# Patient Record
Sex: Female | Born: 1995 | Race: White | Hispanic: No | Marital: Married | Smoking: Current every day smoker
Health system: Southern US, Community
[De-identification: ages and names within clinical notes are randomized; demographics above are authoritative.]

## PROBLEM LIST (undated history)

## (undated) ENCOUNTER — Inpatient Hospital Stay: Payer: Self-pay

## (undated) DIAGNOSIS — N390 Urinary tract infection, site not specified: Secondary | ICD-10-CM

## (undated) DIAGNOSIS — K37 Unspecified appendicitis: Secondary | ICD-10-CM

## (undated) HISTORY — PX: APPENDECTOMY: SHX54

## (undated) HISTORY — DX: Unspecified appendicitis: K37

## (undated) HISTORY — DX: Urinary tract infection, site not specified: N39.0

## (undated) HISTORY — PX: TUBAL LIGATION: SHX77

---
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
## 2014-08-13 ENCOUNTER — Observation Stay
Admission: EM | Admit: 2014-08-13 | Discharge: 2014-08-14 | Disposition: A | Discharge status: Home / Self Care | Payer: 59 | Attending: Surgery | Admitting: Surgery

## 2014-08-13 ENCOUNTER — Emergency Department: Payer: 59 | Admitting: Anesthesiology

## 2014-08-13 ENCOUNTER — Other Ambulatory Visit: Payer: Self-pay | Admitting: Family Medicine

## 2014-08-13 ENCOUNTER — Ambulatory Visit
Admission: RE | Admit: 2014-08-13 | Discharge: 2014-08-13 | Disposition: A | Discharge status: Home / Self Care | Payer: 59 | Source: Ambulatory Visit | Attending: Family Medicine | Admitting: Family Medicine

## 2014-08-13 ENCOUNTER — Encounter
Admission: EM | Disposition: A | Discharge status: Home / Self Care | Payer: Self-pay | Source: Home / Self Care | Attending: Emergency Medicine

## 2014-08-13 DIAGNOSIS — F17210 Nicotine dependence, cigarettes, uncomplicated: Secondary | ICD-10-CM | POA: Diagnosis not present

## 2014-08-13 DIAGNOSIS — K353 Acute appendicitis with localized peritonitis, without perforation or gangrene: Secondary | ICD-10-CM

## 2014-08-13 DIAGNOSIS — R109 Unspecified abdominal pain: Secondary | ICD-10-CM | POA: Diagnosis not present

## 2014-08-13 DIAGNOSIS — R1031 Right lower quadrant pain: Secondary | ICD-10-CM

## 2014-08-13 DIAGNOSIS — Z87440 Personal history of urinary (tract) infections: Secondary | ICD-10-CM | POA: Diagnosis not present

## 2014-08-13 DIAGNOSIS — K3580 Unspecified acute appendicitis: Secondary | ICD-10-CM | POA: Diagnosis not present

## 2014-08-13 DIAGNOSIS — N61 Mastitis without abscess: Secondary | ICD-10-CM | POA: Diagnosis present

## 2014-08-13 HISTORY — PX: LAPAROSCOPIC APPENDECTOMY: SHX408

## 2014-08-13 LAB — BASIC METABOLIC PANEL
ANION GAP: 7 (ref 5–15)
BUN: 9 mg/dL (ref 6–20)
CHLORIDE: 106 mmol/L (ref 101–111)
CO2: 24 mmol/L (ref 22–32)
CREATININE: 0.67 mg/dL (ref 0.44–1.00)
Calcium: 9.7 mg/dL (ref 8.9–10.3)
GFR calc Af Amer: >60 mL/min (ref >60)
GFR calc non Af Amer: >60 mL/min (ref >60)
Glucose, Bld: 97 mg/dL (ref 65–99)
Potassium: 3.2 mmol/L — ABNORMAL LOW (ref 3.5–5.1)
Sodium: 137 mmol/L (ref 135–145)

## 2014-08-13 LAB — CBC WITH DIFFERENTIAL/PLATELET
Basophils Absolute: 0.1 10*3/uL (ref 0–0.1)
Basophils Relative: 1 %
EOS ABS: 0 10*3/uL (ref 0–0.7)
Eosinophils Relative: 0 %
HCT: 40.1 % (ref 35.0–47.0)
Hemoglobin: 13.8 g/dL (ref 12.0–16.0)
LYMPHS ABS: 2.1 10*3/uL (ref 1.0–3.6)
Lymphocytes Relative: 23 %
MCH: 30.1 pg (ref 26.0–34.0)
MCHC: 34.3 g/dL (ref 32.0–36.0)
MCV: 87.5 fL (ref 80.0–100.0)
MONO ABS: 0.5 10*3/uL (ref 0.2–0.9)
Monocytes Relative: 6 %
NEUTROS ABS: 6.1 10*3/uL (ref 1.4–6.5)
NEUTROS PCT: 70 %
Platelets: 217 10*3/uL (ref 150–440)
RBC: 4.58 MIL/uL (ref 3.80–5.20)
RDW: 13.4 % (ref 11.5–14.5)
WBC: 8.8 10*3/uL (ref 3.6–11.0)

## 2014-08-13 SURGERY — APPENDECTOMY, LAPAROSCOPIC
Anesthesia: General

## 2014-08-13 MED ORDER — FENTANYL CITRATE (PF) 100 MCG/2ML IJ SOLN
INTRAMUSCULAR | Status: AC
  Filled 2014-08-13: qty 2

## 2014-08-13 MED ORDER — HYDROCODONE-ACETAMINOPHEN 5-325 MG PO TABS: 1.0000 | ORAL_TABLET | ORAL | Status: DC | PRN

## 2014-08-13 MED ORDER — DEXTROSE 5 % IV SOLN
1.0000 g | Freq: Two times a day (BID) | INTRAVENOUS | Status: DC
Start: 2014-08-13 — End: 2014-08-14
  Administered 2014-08-13: 1000 mg via INTRAVENOUS
  Filled 2014-08-13 (×4): qty 1

## 2014-08-13 MED ORDER — NEOSTIGMINE METHYLSULFATE 10 MG/10ML IV SOLN
INTRAVENOUS | Status: DC | PRN
  Administered 2014-08-13: 3 mg via INTRAVENOUS

## 2014-08-13 MED ORDER — LACTATED RINGERS IV SOLN
INTRAVENOUS | Status: DC | PRN
  Administered 2014-08-13: 20:00:00 via INTRAVENOUS

## 2014-08-13 MED ORDER — PROPOFOL 10 MG/ML IV BOLUS
INTRAVENOUS | Status: DC | PRN
  Administered 2014-08-13: 150 mg via INTRAVENOUS

## 2014-08-13 MED ORDER — HYDROCODONE-ACETAMINOPHEN 5-325 MG PO TABS
1.0000 | ORAL_TABLET | ORAL | Status: DC | PRN
  Administered 2014-08-13 – 2014-08-14 (×3): 1 via ORAL
  Filled 2014-08-13 (×3): qty 1

## 2014-08-13 MED ORDER — BUPIVACAINE-EPINEPHRINE (PF) 0.25% -1:200000 IJ SOLN
INTRAMUSCULAR | Status: DC | PRN
  Administered 2014-08-13: 15 mL

## 2014-08-13 MED ORDER — ONDANSETRON HCL 4 MG/2ML IJ SOLN: 4.0000 mg | Freq: Once | INTRAMUSCULAR | Status: DC | PRN

## 2014-08-13 MED ORDER — ONDANSETRON HCL 4 MG/2ML IJ SOLN
4.0000 mg | Freq: Four times a day (QID) | INTRAMUSCULAR | Status: DC | PRN
  Administered 2014-08-13: 4 mg via INTRAVENOUS

## 2014-08-13 MED ORDER — ONDANSETRON HCL 4 MG/2ML IJ SOLN
4.0000 mg | Freq: Four times a day (QID) | INTRAMUSCULAR | Status: DC | PRN
  Administered 2014-08-13: 4 mg via INTRAVENOUS
  Filled 2014-08-13: qty 2

## 2014-08-13 MED ORDER — IOHEXOL 300 MG/ML  SOLN
80.0000 mL | Freq: Once | INTRAMUSCULAR | Status: AC | PRN
  Administered 2014-08-13: 80 mL via INTRAVENOUS

## 2014-08-13 MED ORDER — LIDOCAINE HCL (CARDIAC) 20 MG/ML IV SOLN
INTRAVENOUS | Status: DC | PRN
  Administered 2014-08-13: 30 mg via INTRAVENOUS

## 2014-08-13 MED ORDER — ONDANSETRON HCL 4 MG PO TABS: 4.0000 mg | ORAL_TABLET | Freq: Four times a day (QID) | ORAL | Status: DC | PRN

## 2014-08-13 MED ORDER — LACTATED RINGERS IV SOLN
INTRAVENOUS | Status: DC
  Administered 2014-08-13: 22:00:00 via INTRAVENOUS

## 2014-08-13 MED ORDER — FENTANYL CITRATE (PF) 100 MCG/2ML IJ SOLN
INTRAMUSCULAR | Status: DC | PRN
  Administered 2014-08-13 (×4): 50 ug via INTRAVENOUS

## 2014-08-13 MED ORDER — MIDAZOLAM HCL 2 MG/2ML IJ SOLN
INTRAMUSCULAR | Status: DC | PRN
  Administered 2014-08-13: 2 mg via INTRAVENOUS

## 2014-08-13 MED ORDER — ROCURONIUM BROMIDE 100 MG/10ML IV SOLN
INTRAVENOUS | Status: DC | PRN
  Administered 2014-08-13: 10 mg via INTRAVENOUS

## 2014-08-13 MED ORDER — MORPHINE SULFATE 2 MG/ML IJ SOLN
2.0000 mg | INTRAMUSCULAR | Status: DC | PRN
  Administered 2014-08-13 – 2014-08-14 (×2): 2 mg via INTRAVENOUS

## 2014-08-13 MED ORDER — SODIUM CHLORIDE 0.9 % IR SOLN
Status: DC | PRN
  Administered 2014-08-13: 3000 mL

## 2014-08-13 MED ORDER — GLYCOPYRROLATE 0.2 MG/ML IJ SOLN
INTRAMUSCULAR | Status: DC | PRN
  Administered 2014-08-13: 0.4 mg via INTRAVENOUS

## 2014-08-13 MED ORDER — DEXAMETHASONE SODIUM PHOSPHATE 4 MG/ML IJ SOLN
INTRAMUSCULAR | Status: DC | PRN
  Administered 2014-08-13: 4 mg via INTRAVENOUS

## 2014-08-13 MED ORDER — FENTANYL CITRATE (PF) 100 MCG/2ML IJ SOLN
25.0000 ug | INTRAMUSCULAR | Status: AC | PRN
  Administered 2014-08-13 (×6): 25 ug via INTRAVENOUS

## 2014-08-13 MED ORDER — MORPHINE SULFATE 2 MG/ML IJ SOLN
2.0000 mg | INTRAMUSCULAR | Status: DC | PRN
  Filled 2014-08-13 (×2): qty 1

## 2014-08-13 MED ORDER — BUPIVACAINE-EPINEPHRINE (PF) 0.25% -1:200000 IJ SOLN
INTRAMUSCULAR | Status: AC
  Filled 2014-08-13: qty 30

## 2014-08-13 MED ORDER — SUCCINYLCHOLINE CHLORIDE 20 MG/ML IJ SOLN
INTRAMUSCULAR | Status: DC | PRN
  Administered 2014-08-13: 100 mg via INTRAVENOUS

## 2014-08-13 MED ORDER — SODIUM CHLORIDE 0.9 % IV BOLUS (SEPSIS)
1000.0000 mL | Freq: Once | INTRAVENOUS | Status: AC
  Administered 2014-08-13: 1000 mL via INTRAVENOUS

## 2014-08-13 SURGICAL SUPPLY — 41 items
ADHESIVE MASTISOL STRL (MISCELLANEOUS) ×3 IMPLANT
APPLIER CLIP ROT 10 11.4 M/L (STAPLE)
BLADE SURG SZ11 CARB STEEL (BLADE) ×3 IMPLANT
CANISTER SUCT 3000ML (MISCELLANEOUS) ×3 IMPLANT
CATH TRAY 16F METER LATEX (MISCELLANEOUS) ×3 IMPLANT
CHLORAPREP W/TINT 26ML (MISCELLANEOUS) ×3 IMPLANT
CLIP APPLIE ROT 10 11.4 M/L (STAPLE) IMPLANT
CLOSURE WOUND 1/2 X4 (GAUZE/BANDAGES/DRESSINGS) ×1
CUTTER LINEAR ENDO 35 ART THIN (STAPLE) ×3 IMPLANT
DEVICE TROCAR PUNCTURE CLOSURE (ENDOMECHANICALS) ×3 IMPLANT
ENDOPOUCH RETRIEVER 10 (MISCELLANEOUS) ×3 IMPLANT
GAUZE SPONGE NON-WVN 2X2 STRL (MISCELLANEOUS) ×3 IMPLANT
GLOVE BIO SURGEON STRL SZ8 (GLOVE) ×3 IMPLANT
GOWN STRL REUS W/ TWL LRG LVL3 (GOWN DISPOSABLE) ×2 IMPLANT
GOWN STRL REUS W/TWL LRG LVL3 (GOWN DISPOSABLE) ×4
IRRIGATION STRYKERFLOW (MISCELLANEOUS) ×1 IMPLANT
IRRIGATOR STRYKERFLOW (MISCELLANEOUS) ×3
KIT RM TURNOVER STRD PROC AR (KITS) ×3 IMPLANT
LABEL OR SOLS (LABEL) ×3 IMPLANT
NDL SAFETY 22GX1.5 (NEEDLE) ×3 IMPLANT
NEEDLE VERESS 14GA 120MM (NEEDLE) ×3 IMPLANT
NS IRRIG 500ML POUR BTL (IV SOLUTION) ×3 IMPLANT
PACK LAP CHOLECYSTECTOMY (MISCELLANEOUS) ×3 IMPLANT
PAD GROUND ADULT SPLIT (MISCELLANEOUS) ×3 IMPLANT
RELOAD /EVU35 (ENDOMECHANICALS) ×6 IMPLANT
RELOAD CUTTER ETS 35MM STAND (ENDOMECHANICALS) ×6 IMPLANT
SCISSORS METZENBAUM CVD 33 (INSTRUMENTS) ×3 IMPLANT
SLEEVE ENDOPATH XCEL 5M (ENDOMECHANICALS) ×3 IMPLANT
SOL .9 NS 3000ML IRR  AL (IV SOLUTION) ×2
SOL .9 NS 3000ML IRR UROMATIC (IV SOLUTION) ×1 IMPLANT
SPONGE LAP 18X18 5 PK (GAUZE/BANDAGES/DRESSINGS) ×3 IMPLANT
SPONGE VERSALON 2X2 STRL (MISCELLANEOUS) ×6
STRIP CLOSURE SKIN 1/2X4 (GAUZE/BANDAGES/DRESSINGS) ×2 IMPLANT
SUT MNCRL 4-0 (SUTURE) ×2
SUT MNCRL 4-0 27XMFL (SUTURE) ×1
SUT VICRYL 0 TIES 12 18 (SUTURE) ×3 IMPLANT
SUTURE MNCRL 4-0 27XMF (SUTURE) ×1 IMPLANT
TRAP MUCOUS 40ML (MISCELLANEOUS) ×3 IMPLANT
TROCAR XCEL 12X100 BLDLESS (ENDOMECHANICALS) ×3 IMPLANT
TROCAR XCEL NON-BLD 5MMX100MML (ENDOMECHANICALS) ×3 IMPLANT
TUBING INSUFFLATOR HI FLOW (MISCELLANEOUS) ×3 IMPLANT

## 2014-08-13 NOTE — Anesthesia Preprocedure Evaluation (Addendum)
Anesthesia Evaluation  Patient identified by MRN, date of birth, ID band Patient awake    Reviewed: Allergy & Precautions, NPO status , Patient's Chart, lab work & pertinent test results  Airway Mallampati: II  TM Distance: >3 FB Neck ROM: Full    Dental no notable dental hx.    Pulmonary Current Smoker,    Pulmonary exam normal       Cardiovascular negative cardio ROS Normal cardiovascular exam    Neuro/Psych PSYCHIATRIC DISORDERS Depression negative neurological ROS     GI/Hepatic Neg liver ROS, Acute appendecitis   Endo/Other  negative endocrine ROS  Renal/GU negative Renal ROS  negative genitourinary   Musculoskeletal negative musculoskeletal ROS (+)   Abdominal (+)  Abdomen: tender.    Peds negative pediatric ROS (+)  Hematology negative hematology ROS (+)   Anesthesia Other Findings Neg urine preg  Reproductive/Obstetrics negative OB ROS                         Anesthesia Physical Anesthesia Plan  ASA: II and emergent  Anesthesia Plan: General   Post-op Pain Management:    Induction: Intravenous, Rapid sequence and Cricoid pressure planned  Airway Management Planned: Oral ETT  Additional Equipment:   Intra-op Plan:   Post-operative Plan: Extubation in OR  Informed Consent: I have reviewed the patients History and Physical, chart, labs and discussed the procedure including the risks, benefits and alternatives for the proposed anesthesia with the patient or authorized representative who has indicated his/her understanding and acceptance.   Dental advisory given  Plan Discussed with: CRNA and Surgeon  Anesthesia Plan Comments:        Anesthesia Quick Evaluation

## 2014-08-13 NOTE — Discharge Instructions (Signed)
Remove dressing in 24 hours. °May shower in 24 hours. °Leave paper strips in place. °Resume all home medications. °Follow-up with Dr. Shanaya Schneck in 10 days. °

## 2014-08-13 NOTE — Transfer of Care (Signed)
Immediate Anesthesia Transfer of Care Note  Patient: Nancy Koch  Procedure(s) Performed: Procedure(s): APPENDECTOMY LAPAROSCOPIC (N/A)  Patient Location: PACU  Anesthesia Type:General  Level of Consciousness: awake, oriented and patient cooperative  Airway & Oxygen Therapy: Patient Spontanous Breathing and Patient connected to face mask oxygen  Post-op Assessment: Report given to RN and Post -op Vital signs reviewed and stable  Post vital signs: Reviewed and stable  Last Vitals:  Filed Vitals:   08/13/14 1657  BP: 118/76  Pulse: 86  Temp: 36.9 C  Resp: 16    Complications: No apparent anesthesia complications

## 2014-08-13 NOTE — Progress Notes (Signed)
Patient's history reviewed in chart and with Dr. Egbert Garibaldi and with the patient.  Physical exam performed patient is tender in the right lower quadrant with a positive Rovsing sign no left lower quadrant tenderness.  White blood cell count is noted.. CT scan is personally reviewed there is an area in the left side that she suggested intussusception.  I discussed with she and her family the rationale for offering diagnostic laparoscopy and laparoscopic appendectomy and also viewing this area of suggestive intussusception which appears to be asymptomatic contrast flows through on CT scan. I discussed this with them I would not perform any sort of bowel resection unless it was obviously intussuscepted and nonreducible the risks of bleeding infection negative appendectomy inversion to an open procedure small bowel resection was reviewed with them they understood and agreed to proceed

## 2014-08-13 NOTE — Op Note (Signed)
laparascopic appendectomy   Nancy Koch Date of operation:  08/13/2014  Indications: The patient presented with a history of  abdominal pain. Workup has revealed findings consistent with acute appendicitis. She also has findings on CT scan in the left lower quadrant suggestive of a nonobstructive intussusception this is not consistent with her physical exam but will be reviewed during the diagnostic laparoscopy.  Pre-operative Diagnosis: Acute appendicitis  Post-operative Diagnosis: Acute appendicitis  Surgeon: Adah Salvage. Excell Seltzer, MD, FACS  Anesthesia: General with endotracheal tube  Procedure Details  The patient was seen again in the preop area. The options of surgery versus observation were reviewed with the patient and/or family. The risks of bleeding, infection, recurrence of symptoms, negative laparoscopy, potential for an open procedure, bowel injury, abscess or infection, were all reviewed as well. I also discussed the findings of the CT scan suggesting and it intussusception on the left side her exam is not consistent with that and she is completely asymptomatic and nonobstructive. I did discuss the need for observation of this area and possible further therapy should it be present or reoccur she understood and agreed with this plan. The patient was taken to Operating Room, identified as Nancy Koch and the procedure verified as laparoscopic appendectomy. A Time Out was held and the above information confirmed.  The patient was placed in the supine position and general anesthesia was induced.  Antibiotic prophylaxis was administered and VT E prophylaxis was in place. A Foley catheter was placed by the nursing staff.   The abdomen was prepped and draped in a sterile fashion. An infraumbilical incision was made. A Veress needle was placed and pneumoperitoneum was obtained. A 5 mm trocar port was placed without difficulty and the abdominal cavity was explored.  Under direct  vision a 5 mm suprapubic port was placed and a 13 mm left lateral port was placed all under direct vision.   Diagnostic laparoscopy was performed and with all ports in position the bowel in from the left upper quadrant to the left lower quadrant was inspected and run. There was no sign of thickened bowel, intussusception, closed loop obstruction, or bowel injury. This was done twice and then repeated after the appendectomy portion of the exam as well. The ovaries bilaterally appeared normal there was no fluid in the pelvis. The the uterus appeared completely normal as did the left colon.  The appendix was identified and found to be acutely inflamed albeit minimally inflamed. The appendix was carefully dissected. The base of the appendix was dissected out and divided with a standard load Endo GIA. The mesoappendix was divided with a vascular load Endo GIA.  The appendix was passed out through the left lateral port site with the aid of an Endo Catch bag. The right lower quadrant and pelvis was then irrigated with copious amounts of normal saline which was aspirated. Inspection  failed to identify any additional bleeding and there were no signs of bowel injury. The bowel in the left side of the abdomen was again run and visualized to show no sign of intussusception or abnormality. Therefore the left lateral port site was closed under direct vision utilizing an Endo Close technique with 0 Vicryl interrupted sutures, all under direct vision.   Again the right lower quadrant was inspected there was no sign of bleeding or bowel injury therefore pneumoperitoneum was released, all ports were removed and the skin incisions were approximated with subcuticular 4-0 Monocryl. Steri-Strips and Mastisol and sterile dressings were placed.  The patient tolerated the procedure well, there were no complications. The sponge lap and needle count were correct at the end of the procedure.  The patient was taken to the recovery  room in stable condition to be admitted for continued care.  Findings: Acute appendicitis, early. No sign of intussusception or abnormal closed loop obstruction.  Estimated Blood Loss: Minimal                  Specimens: appendix         Complications:  None                  Cheyrl Buley E. Excell Seltzer MD, FACS

## 2014-08-13 NOTE — ED Provider Notes (Signed)
[facility] Emergency Department Provider Note  ____________________________________________  Time seen: 5:15 PM  I have reviewed the triage vital signs and the nursing notes.   HISTORY  Chief Complaint Flank Pain    HPI Nancy Koch is a 19 y.o. female who complains of gradual onset constant right lower quadrant abdominal pain for the last 3 days. She does have a history of a right ovarian dermoid cyst that causes her pain every now and then, but this feels very different. This pain is aching and deep in the abdomen, hurts when she moves. Hurt when she drives in a car hits bumps in the [place]. Nonradiating. She has not had much of an appetite the last couple of days and has not eaten since about 6:00 AM today. No nausea vomiting diarrhea fever or chills. No prior medical history or surgeries.  Has an appointment with [facility] gynecology August 17 for follow-up of the dermoid cyst   History reviewed. No pertinent past medical history.  Patient Active Problem List   Diagnosis Date Noted  . Depressive disorder 12/29/2011    History reviewed. No pertinent past surgical history.  No current outpatient prescriptions on file.  Allergies Review of patient's allergies indicates no known allergies.  No family history on file.  Social History History  Substance Use Topics  . Smoking status: Current Every Day Smoker -- 1.00 packs/day    Types: Cigarettes  . Smokeless tobacco: Never Used  . Alcohol Use: No    Review of Systems  Constitutional: No fever or chills. No weight changes Eyes:No blurry vision or double vision.  ENT: No sore throat. Cardiovascular: No chest pain. Respiratory: No dyspnea or cough. Gastrointestinal: Positive abdominal pain without vomiting or diarrhea.  No BRBPR or melena. Genitourinary: positive dysuria x 3 days. Musculoskeletal: Negative for back pain. No joint swelling or pain. Skin: Negative for  rash. Neurological: Negative for headaches, focal weakness or numbness. Psychiatric:No anxiety or depression.   Endocrine:No hot/cold intolerance, changes in energy, or sleep difficulty.  10-point ROS otherwise negative.  ____________________________________________   PHYSICAL EXAM:  VITAL SIGNS: ED Triage Vitals  Enc Vitals Group     BP 08/13/14 1657 118/76 mmHg     Pulse Rate 08/13/14 1657 86     Resp 08/13/14 1657 16     Temp 08/13/14 1657 98.4 F (36.9 C)     Temp Source 08/13/14 1657 Oral     SpO2 08/13/14 1657 99 %     Weight 08/13/14 1657 127 lb (57.607 kg)     Height 08/13/14 1657 5\' 5"  (1.651 m)     Head Cir --      Peak Flow --      Pain Score 08/13/14 1658 5     Pain Loc --      Pain Edu? --      Excl. in GC? --      Constitutional: Alert and oriented. Well appearing and in no distress. Eyes: No scleral icterus. No conjunctival pallor. PERRL. EOMI ENT   Head: Normocephalic and atraumatic.   Nose: No congestion/rhinnorhea. No septal hematoma   Mouth/Throat: MMM, no pharyngeal erythema. No peritonsillar mass. No uvula shift.   Neck: No stridor. No SubQ emphysema. No meningismus. Hematological/Lymphatic/Immunilogical: No cervical lymphadenopathy. Cardiovascular: RRR. Normal and symmetric distal pulses are present in all extremities. No murmurs, rubs, or gallops. Respiratory: Normal respiratory effort without tachypnea nor retractions. Breath sounds are clear and equal bilaterally. No wheezes/rales/rhonchi. Gastrointestinal: Guarding with severe right lower  quadrant tenderness and rebound. There is tenderness in the left lower quadrant and right upper quadrant that refers to the right lower quadrant. No pain with heel tap.. No distention. There is no CVA tenderness.   Genitourinary: deferred Musculoskeletal: Nontender with normal range of motion in all extremities. No joint effusions.  No lower extremity tenderness.  No edema. Neurologic:   Normal  speech and language.  CN 2-10 normal. Motor grossly intact. No pronator drift.  Normal gait. No gross focal neurologic deficits are appreciated.  Skin:  Skin is warm, dry and intact. No rash noted.  No petechiae, purpura, or bullae. Psychiatric: Mood and affect are normal. Speech and behavior are normal. Patient exhibits appropriate insight and judgment.  ____________________________________________    LABS (pertinent positives/negatives) (all labs ordered are listed, but only abnormal results are displayed) Labs Reviewed - No data to display ____________________________________________   EKG    ____________________________________________    RADIOLOGY  CT from earlier today shows mildly dilated appendix concerning for early appendicitis  ____________________________________________   PROCEDURES  ____________________________________________   INITIAL IMPRESSION / ASSESSMENT AND PLAN / ED COURSE  Pertinent labs & imaging results that were available during my care of the patient were reviewed by me and considered in my medical decision making (see chart for details).  Exam and CT both concerning and consistent with appendicitis. Discussed with Dr. Egbert Garibaldi from surgery at 5:40 PM who will evaluate the patient in the ED for further management. We'll keep the patient nothing by mouth for now, and defer further management including antibiotics to surgery.  ----------------------------------------- 5:52 PM on 08/13/2014 -----------------------------------------  Dr. Egbert Garibaldi at bedside. We'll follow-up and recommendations.  ____________________________________________   FINAL CLINICAL IMPRESSION(S) / ED DIAGNOSES  Final diagnoses:  Acute appendicitis with localized peritonitis      Sharman Cheek, MD 08/13/14 1753

## 2014-08-13 NOTE — Anesthesia Procedure Notes (Signed)
Procedure Name: Intubation Date/Time: 08/13/2014 7:52 PM Performed by: Waldo Laine Pre-anesthesia Checklist: Emergency Drugs available, Patient identified, Suction available, Patient being monitored and Timeout performed Patient Re-evaluated:Patient Re-evaluated prior to inductionOxygen Delivery Method: Circle system utilized Preoxygenation: Pre-oxygenation with 100% oxygen Intubation Type: IV induction, Rapid sequence and Cricoid Pressure applied Laryngoscope Size: 2 Grade View: Grade I Tube type: Oral Number of attempts: 1 Placement Confirmation: ETT inserted through vocal cords under direct vision Secured at: 20 cm Tube secured with: Tape Dental Injury: Teeth and Oropharynx as per pre-operative assessment

## 2014-08-13 NOTE — H&P (Signed)
Nancy Koch is an 19 y.o. female.     Chief Complaint: Right lower quadrant abdominal pain since Sunday night.  HPI: 19 year old otherwise healthy white female with a history of dermoid cyst who presents to the emergency room in referral by her primary care physician following 3 days of right lower quadrant abdominal pain associated with some nausea but no vomiting. She's had no similar episodes in the past. She's had some dysuria. She has a history of urinary tract infections. She's had no blood in her urine. There is been no yellow jaundice. No past surgical history. Imaging studies demonstrated findings consistent with early acute appendicitis without perforation. Furthermore there is a small intussusception seen in the small bowel however the patient has had no obstructive symptoms. There has been no sick contacts.  History reviewed. No pertinent past medical history. Except for that of dermoid cyst by her history. History reviewed. No pertinent past surgical history.  No family history on file. significant for appendicitis.  Social History:  reports that she has been smoking Cigarettes.  She has been smoking about 1.00 pack per day. She has never used smokeless tobacco. She reports that she does not drink alcohol or use illicit drugs.  Allergies: No Known Allergies   Review of Systems  Constitutional: Positive for malaise/fatigue. Negative for fever, chills, weight loss and diaphoresis.  HENT: Negative.   Eyes: Negative.   Cardiovascular: Negative.   Gastrointestinal: Positive for nausea and abdominal pain. Negative for heartburn, vomiting, diarrhea, constipation, blood in stool and melena.  Genitourinary: Positive for dysuria and urgency. Negative for frequency and hematuria.  Skin: Negative for itching and rash.  Neurological: Positive for weakness.  Endo/Heme/Allergies: Negative.   All other systems reviewed and are negative.   Physical Exam:  Physical Exam    Constitutional: She is oriented to person, place, and time and well-developed, well-nourished, and in no distress. No distress.  HENT:  Head: Atraumatic.  Eyes: Conjunctivae are normal. Pupils are equal, round, and reactive to light.  Cardiovascular: Normal rate and regular rhythm.   Pulmonary/Chest: Breath sounds normal. She is in respiratory distress. She has no wheezes.  Abdominal: Soft. Normal appearance and bowel sounds are normal. There is no hepatosplenomegaly. There is tenderness in the right lower quadrant. There is rebound and tenderness at McBurney's point. There is no CVA tenderness. No hernia.    Neurological: She is alert and oriented to person, place, and time.  Skin: Skin is warm and dry. No rash noted. She is not diaphoretic. No pallor.  Psychiatric: Mood, memory, affect and judgment normal.    Blood pressure 118/76, pulse 86, temperature 98.4 F (36.9 C), temperature source Oral, resp. rate 16, height  (1.651 m), weight 127 lb (57.607 kg), last menstrual period 07/27/2014, SpO2 99 %.  No results found for this or any previous visit (from the past 48 hour(s)). Ct Abdomen Pelvis W Contrast  08/13/2014   CLINICAL DATA:  Right lower quadrant pain 4 days with nausea.  EXAM: CT ABDOMEN AND PELVIS WITH CONTRAST  TECHNIQUE: Multidetector CT imaging of the abdomen and pelvis was performed using the standard protocol following bolus administration of intravenous contrast.  CONTRAST:  80mL OMNIPAQUE IOHEXOL 300 MG/ML  SOLN  COMPARISON:  None.  FINDINGS: Lung bases are within normal.  Abdominal images demonstrate a normal liver, spleen, pancreas, gallbladder and adrenal glands. Kidneys normal in size without hydronephrosis or nephrolithiasis. Ureters are within normal.  The appendix is well-visualized and is only very minimally dilated  over its distal segment with air within the tip as the appendix measures 7.8 mm in diameter. Subtle mucosal enhancement over the mid to distal segment.  No adjacent inflammatory change or free fluid.  There is a focal small bowel intussusception of the left mid abdomen without evidence of obstruction. There is no adjacent free fluid or inflammatory change.  Vascular structures are normal. Colon is within normal. The mesentery is normal.  Pelvic images demonstrate the bladder, uterus, ovaries and rectum to be within normal. There is a mild amount of free fluid in the cul-de-sac.  Bones soft tissues are within normal.  IMPRESSION: Appendix slightly enlarged measuring 7.8 mm in diameter over its mid to distal segment with minimal mucosal enhancement. No significant adjacent inflammatory change or free fluid. Findings are equivocal for early appendicitis as recommend clinical correlation and possible follow-up CT in 24-48 hours.  Focal small bowel intussusception over the left mid abdomen without adjacent inflammatory change or free fluid. No evidence of obstruction.  Mild free pelvic fluid.  These results were called by telephone at the time of interpretation on 08/13/2014 at 3:18 pm to Dr. Simonne Come nurse, Shary Key, who verbally acknowledged these results.   Electronically Signed   By: Elberta Fortis M.D.   On: 08/13/2014 15:18   Review of urinalysis from referring physician is negative.  CBC is pending.  Urine pregnancy test was negative.  Assessment/Plan 19 year old otherwise healthy white female with early acute appendicitis.  I discussed with her family and her laparoscopic appendectomy performed later tonight by my associate Dr. Excell Seltzer. I believe that the small bowel intussusception is a radiographic phenomenon however this can be further evaluated time of diagnostic laparoscopy.  Raynald Kemp, MD, FACS

## 2014-08-13 NOTE — ED Notes (Signed)
Dr. Bird in to see pt.

## 2014-08-13 NOTE — Anesthesia Postprocedure Evaluation (Signed)
  Anesthesia Post-op Note  Patient: Nancy Koch  Procedure(s) Performed: Procedure(s): APPENDECTOMY LAPAROSCOPIC (N/A)  Anesthesia type:General  Patient location: PACU  Post pain: Pain level controlled  Post assessment: Post-op Vital signs reviewed, Patient's Cardiovascular Status Stable, Respiratory Function Stable, Patent Airway and No signs of Nausea or vomiting  Post vital signs: Reviewed and stable  Last Vitals:  Filed Vitals:   08/13/14 2045  BP:   Pulse:   Temp: 35.9 C  Resp:     Level of consciousness: awake, alert  and patient cooperative  Complications: No apparent anesthesia complications

## 2014-08-13 NOTE — ED Notes (Signed)
Pt sent by MD Dayna Barker for possible appendicitis.  Pt c/o r flank pain w/ n/v.

## 2014-08-14 ENCOUNTER — Encounter: Payer: Self-pay | Admitting: Surgery

## 2014-08-14 NOTE — Progress Notes (Signed)
   POD 1  Stable overnight  abd soft  Stable and improved for discharge home.

## 2014-08-15 LAB — SURGICAL PATHOLOGY

## 2014-08-16 NOTE — Discharge Summary (Signed)
Physician Discharge Summary  Patient ID: Nancy Koch MRN: [id number] DOB/AGE: 03-29-1995 18 y.o.  Admit date: 08/13/2014 Discharge date: 08/14/2014  Admission Diagnoses: Early acute appendicitis  Discharge Diagnoses:  Principal Problem:   Acute appendicitis  Discharged Condition: stable  Hospital Course:  Patient was admitted following laparoscopic appendectomy performed by Dr. Excell Seltzer. Postoperative day #1 she was tolerating regular diet and had adequate pain control and soft abdomen. She was stable and improved for discharge.  Consults: None  Significant Diagnostic Studies: CT scan abdomen and pelvis  Treatments: Lap scopic appendectomy  Disposition: 01-Home or Self Care     Medication List    TAKE these medications        HYDROcodone-acetaminophen 5-325 MG per tablet  Commonly known as:  NORCO/VICODIN  Take 1-2 tablets by mouth every 4 (four) hours as needed for moderate pain.     hydrOXYzine 10 MG tablet  Commonly known as:  ATARAX/VISTARIL  Take 10 mg by mouth 3 (three) times daily as needed for anxiety.     meloxicam 15 MG tablet  Commonly known as:  MOBIC  Take 15 mg by mouth daily.     mirtazapine 15 MG tablet  Commonly known as:  REMERON  Take 15 mg by mouth at bedtime as needed (for sleep).           Follow-up Information    Follow up with Ida Rogue, MD On 08/25/2014.   Specialty:  Surgery   Why:  For wound re-check with Dr. Juliann Pulse in [place] office 08/25/14 9:45am bring insurance card and id.   Contact information:   [address] [place] [address] [phone]       Signed: Natale Lay 08/16/2014, 3:33 PM

## 2014-08-22 ENCOUNTER — Encounter: Payer: Self-pay | Admitting: *Deleted

## 2014-08-25 ENCOUNTER — Ambulatory Visit (INDEPENDENT_AMBULATORY_CARE_PROVIDER_SITE_OTHER): Payer: 59 | Admitting: Surgery

## 2014-08-25 ENCOUNTER — Encounter: Payer: Self-pay | Admitting: Surgery

## 2014-08-25 VITALS — BP 120/69 | HR 96 | Temp 98.3°F | Ht 65.0 in | Wt 129.0 lb

## 2014-08-25 DIAGNOSIS — Z09 Encounter for follow-up examination after completed treatment for conditions other than malignant neoplasm: Secondary | ICD-10-CM

## 2014-08-25 NOTE — Patient Instructions (Signed)
Do not drive on pain medications Do not lift greater than 15 lbs for a period of 6 weeks Call or return to ER if you develop fever greater than 101.5, nausea/vomiting, increased pain, redness/drainage from incisions  

## 2014-08-25 NOTE — Progress Notes (Signed)
Surgery Office Note  S: No acute issues. C/o vague LLQ pain.  + loose stools every 2 days.  Has not been taking any pain medicine O:Blood pressure 120/69, pulse 96, temperature 98.3 F (36.8 C), temperature source Oral, height  (1.651 m), weight 129 lb (58.514 kg), last menstrual period 07/27/2014. GEN: NAD/A&Ox3 ABD: soft, min tender, nondistended, incision c/d/i  A/P 19 yo s/p lap appy, doing well.  Still some pain.  + loose stools - no acute issues - encouraging pain medication - f/u in 2 weeks to ensure resolution

## 2014-09-10 ENCOUNTER — Ambulatory Visit: Payer: 59 | Admitting: Surgery

## 2014-10-08 ENCOUNTER — Other Ambulatory Visit: Payer: Self-pay | Admitting: Obstetrics and Gynecology

## 2014-10-08 DIAGNOSIS — N838 Other noninflammatory disorders of ovary, fallopian tube and broad ligament: Secondary | ICD-10-CM

## 2014-10-16 ENCOUNTER — Ambulatory Visit: Payer: 59

## 2014-10-16 ENCOUNTER — Ambulatory Visit
Admission: RE | Admit: 2014-10-16 | Discharge: 2014-10-16 | Disposition: A | Discharge status: Home / Self Care | Payer: 59 | Source: Ambulatory Visit | Attending: Obstetrics and Gynecology | Admitting: Obstetrics and Gynecology

## 2014-10-16 DIAGNOSIS — N838 Other noninflammatory disorders of ovary, fallopian tube and broad ligament: Secondary | ICD-10-CM

## 2014-10-16 DIAGNOSIS — R109 Unspecified abdominal pain: Secondary | ICD-10-CM | POA: Diagnosis not present

## 2014-10-16 MED ORDER — GADOBENATE DIMEGLUMINE 529 MG/ML IV SOLN
15.0000 mL | Freq: Once | INTRAVENOUS | Status: AC | PRN
  Administered 2014-10-16: 11 mL via INTRAVENOUS

## 2014-11-04 NOTE — H&P (Signed)
[name] is a 19 y.o. female here for Right pelvic pain . Pt with increasing rlq pain . MR + KUB failed to show ovarian mass . S/P appendectomy in Aug 2016, but this pain preceeded her surgery . bowel movement have been nl . Since starting Miralax and metamucil   Past Medical History:  has a past medical history of Anxiety and Ovarian cyst.  Past Surgical History:  has a past surgical history that includes Appendectomy. Family History: family history includes Breast cancer in her maternal grandfather; Cervical cancer in her mother; Diabetes type II in her father and paternal grandfather; Hyperlipidemia in her father; Hypertension in her father; Migraines in her mother. Social History:  reports that she has been smoking. She has a 1.00 pack-year smoking history. She has never used smokeless tobacco. She reports that she does not drink alcohol. OB/GYN History:  OB History    Gravida Para Term Preterm AB TAB SAB Ectopic Multiple Living   0 0 0 0 0 0 0 0 0 0       Allergies: has No Known Allergies. Medications:  Current Outpatient Prescriptions:  . HYDROcodone-acetaminophen (VICODIN) 5-300 mg Tab tablet, 1 tab po q 6 hrs for pain, Disp: 20 tablet, Rfl: 0 . ibuprofen (ADVIL,MOTRIN) 800 MG tablet, Take 1 tablet (800 mg total) by mouth 2 (two) times daily as needed for Pain., Disp: 30 tablet, Rfl: 0 . norethindrone-ethinyl estradiol (ORTHO-NOVUM, NORTREL) 1-35 mg-mcg tablet, Take 1 tablet by mouth once daily., Disp: 1 Package, Rfl: 11 . polyethylene glycol (MIRALAX) powder, Take 17 g by mouth once daily. Mix in 4-8ounces of fluid prior to taking., Disp: 255 g, Rfl: 11 . meloxicam (MOBIC) 15 MG tablet, Take 1 tablet (15 mg total) by mouth once daily. (Patient not taking: Reported on 10/08/2014 ), Disp: 30 tablet, Rfl: 0 . MIRTAZAPINE ORAL, Take by mouth., Disp: , Rfl:   Review of Systems: General:   No fatigue or weight loss Eyes:   No vision changes Ears:   No hearing  difficulty Respiratory:   No cough or shortness of breath Pulmonary:   No asthma or shortness of breath Cardiovascular:  No chest pain, palpitations, dyspnea on exertion Gastrointestinal:  No abdominal bloating, chronic diarrhea, constipations, masses, pain or hematochezia Genitourinary:  No hematuria, dysuria, abnormal vaginal discharge, pelvic pain, Menometrorrhagia Lymphatic:  No swollen lymph nodes Musculoskeletal: No muscle weakness Neurologic:  No extremity weakness, syncope, seizure disorder Psychiatric:  No history of depression, delusions or suicidal/homicidal ideation   Exam:      Vitals:   11/04/14 1349  BP: 124/67  Pulse: 72    Body mass index is 21.47 kg/(m^2).  WDWN white/ female in NAD  Lungs: CTA  CV : RRR without murmur   Abdomen: hypoactive BS, non distended . RLQ TTP . No rebound TTP  Pelvic: tanner stage 5 ,  External genitalia: vulva /labia no lesions Urethra: no prolapse Vagina: bloody d/c Cervix: no lesions, no cervical motion tenderness  Uterus: normal size shape and contour, non-tender Adnexa: no mass, non-tender  TV U/S: no abnormality seen , ovaries are nl    Impression:   The primary encounter diagnosis was Pelvic pain in female. A diagnosis of Screen for STD (sexually transmitted disease) was also pertinent to this visit.PAin now ongoing for 2 yrs and getting worse . High likelihood of endometriosis    Plan:   I have spoken with the patient regarding treatment options including expectant management, hormonal options, or surgical intervention. After a full  discussion the pt elects to proceed with Diagnostic L/S and possible excision of endometriosis   Benefits and risks to surgery: diagnostic L/S, possible removal of endometriosis The proposed benefit of the surgery has been discussed with the patient. The possible risks include, but are not limited to: organ injury to the bowel , bladder, ureters, and major blood vessels  and nerves. There is a possibility of additional surgeries resulting from these injuries. There is also the risk of blood transfusion and the need to receive blood products during or after the procedure which may rarely lead to HIV or Hepatitis C infection. There is a risk of developing a deep venous thrombosis or a pulmonary embolism . There is the possibility of wound infection and also anesthetic complications, even the rare possibility of death. The patient understands these risks and wishes to proceed. All questions have been answered and the consent has been signed.                                               Vilma Prader, MD           Office Visit on 11/04/2014      Department  Name Address Phone Fax  [facility] [address] [place] [address] [phone]  Service Location  Name Address      [facility] MEDICINE SERV[facility] ARE[facility] [address] [place] [address]

## 2014-11-06 ENCOUNTER — Encounter
Admission: RE | Admit: 2014-11-06 | Discharge: 2014-11-06 | Disposition: A | Discharge status: Home / Self Care | Payer: 59 | Source: Ambulatory Visit | Attending: Obstetrics and Gynecology | Admitting: Obstetrics and Gynecology

## 2014-11-06 DIAGNOSIS — R102 Pelvic and perineal pain: Secondary | ICD-10-CM | POA: Diagnosis not present

## 2014-11-06 DIAGNOSIS — Z01818 Encounter for other preprocedural examination: Secondary | ICD-10-CM | POA: Insufficient documentation

## 2014-11-06 DIAGNOSIS — G8929 Other chronic pain: Secondary | ICD-10-CM | POA: Diagnosis not present

## 2014-11-06 LAB — BASIC METABOLIC PANEL
ANION GAP: 5 (ref 5–15)
BUN: 12 mg/dL (ref 6–20)
CHLORIDE: 108 mmol/L (ref 101–111)
CO2: 23 mmol/L (ref 22–32)
Calcium: 9.2 mg/dL (ref 8.9–10.3)
Creatinine, Ser: 0.63 mg/dL (ref 0.44–1.00)
GFR calc Af Amer: >60 mL/min (ref >60)
GLUCOSE: 90 mg/dL (ref 65–99)
POTASSIUM: 4.2 mmol/L (ref 3.5–5.1)
Sodium: 136 mmol/L (ref 135–145)

## 2014-11-06 LAB — CBC
HEMATOCRIT: 37.5 % (ref 35.0–47.0)
HEMOGLOBIN: 12.3 g/dL (ref 12.0–16.0)
MCH: 29.3 pg (ref 26.0–34.0)
MCHC: 32.8 g/dL (ref 32.0–36.0)
MCV: 89.3 fL (ref 80.0–100.0)
Platelets: 197 10*3/uL (ref 150–440)
RBC: 4.19 MIL/uL (ref 3.80–5.20)
RDW: 13 % (ref 11.5–14.5)
WBC: 5 10*3/uL (ref 3.6–11.0)

## 2014-11-06 LAB — TYPE AND SCREEN
ABO/RH(D): O NEG
ANTIBODY SCREEN: NEGATIVE

## 2014-11-06 NOTE — Patient Instructions (Signed)
  Your procedure is scheduled on:11/10/14 Report to Day Surgery. To find out your arrival time please call [phone] between 1PM - 3PM on 11/07/14.  Remember: Instructions that are not followed completely may result in serious medical risk, up to and including death, or upon the discretion of your surgeon and anesthesiologist your surgery may need to be rescheduled.    _x___ 1. Do not eat food or drink liquids after midnight. No gum chewing or hard candies.     __x__ 2. No Alcohol for 24 hours before or after surgery.   __x__ 3. Bring all medications with you on the day of surgery if instructed.    __x__ 4. Notify your doctor if there is any change in your medical condition     (cold, fever, infections).     Do not wear jewelry, make-up, hairpins, clips or nail polish.  Do not wear lotions, powders, or perfumes. You may wear deodorant.  Do not shave 48 hours prior to surgery. Men may shave face and neck.  Do not bring valuables to the hospital.    [facility] not [facility] for any belongings or valuables.               Contacts, dentures or bridgework may not be worn into surgery.  Leave your suitcase in the car. After surgery it may be brought to your room.  For patients admitted to the hospital, discharge time is determined by your                treatment team.   Patients discharged the day of surgery will not be allowed to drive home.   Please read over the following fact sheets that you were given:   Coughing and Deep Breathing, Surgical Site Infection Prevention and Care and Recovery After Surgery   ____ Take these medicines the morning of surgery with A SIP OF WATER:    1. May take pain med if necessary  2.   3.   4.  5.  6.  ____ Fleet Enema (as directed)   ____ Use CHG Soap as directed  ____ Use inhalers on the day of surgery  ____ Stop metformin 2 days prior to surgery    ____ Take 1/2 of usual insulin dose the night before surgery and none on the  morning of surgery.   ____ Stop Coumadin/Plavix/aspirin on   ____ Stop Anti-inflammatories on Tylenol or norco only for pain   ____ Stop supplements until after surgery.    ____ Bring C-Pap to the hospital.

## 2014-11-09 ENCOUNTER — Emergency Department
Admission: EM | Admit: 2014-11-09 | Discharge: 2014-11-09 | Disposition: A | Discharge status: Home / Self Care | Payer: 59 | Attending: Emergency Medicine | Admitting: Emergency Medicine

## 2014-11-09 ENCOUNTER — Encounter: Payer: Self-pay | Admitting: Emergency Medicine

## 2014-11-09 DIAGNOSIS — Z791 Long term (current) use of non-steroidal anti-inflammatories (NSAID): Secondary | ICD-10-CM | POA: Diagnosis not present

## 2014-11-09 DIAGNOSIS — Z3202 Encounter for pregnancy test, result negative: Secondary | ICD-10-CM | POA: Diagnosis not present

## 2014-11-09 DIAGNOSIS — Z720 Tobacco use: Secondary | ICD-10-CM | POA: Insufficient documentation

## 2014-11-09 DIAGNOSIS — R1031 Right lower quadrant pain: Secondary | ICD-10-CM | POA: Diagnosis present

## 2014-11-09 DIAGNOSIS — R102 Pelvic and perineal pain: Secondary | ICD-10-CM | POA: Diagnosis not present

## 2014-11-09 LAB — URINALYSIS COMPLETE WITH MICROSCOPIC (ARMC ONLY)
BILIRUBIN URINE: NEGATIVE
GLUCOSE, UA: NEGATIVE mg/dL
HGB URINE DIPSTICK: NEGATIVE
Ketones, ur: NEGATIVE mg/dL
NITRITE: NEGATIVE
Protein, ur: NEGATIVE mg/dL
SPECIFIC GRAVITY, URINE: 1.023 (ref 1.005–1.030)
pH: 7 (ref 5.0–8.0)

## 2014-11-09 LAB — POCT PREGNANCY, URINE: Preg Test, Ur: NEGATIVE

## 2014-11-09 MED ORDER — KETOROLAC TROMETHAMINE 30 MG/ML IJ SOLN
30.0000 mg | Freq: Once | INTRAMUSCULAR | Status: AC
  Administered 2014-11-09: 30 mg via INTRAMUSCULAR
  Filled 2014-11-09: qty 1

## 2014-11-09 NOTE — Discharge Instructions (Signed)

## 2014-11-09 NOTE — ED Provider Notes (Signed)
[facility] Emergency Department Provider Note  ____________________________________________  Time seen: On arrival  I have reviewed the triage vital signs and the nursing notes.   HISTORY  Chief Complaint Abdominal Pain    HPI Nancy Koch is a 19 y.o. female who presents with right lower quadrant abdominal pain. Patient reports she has had right lower quadrant abdominal pain intermittently over the last year she has had an extensive workup for this including a recent MRI of the pelvis. Tomorrow she has surgery scheduled to look for possible endometriosis. She reports the pain started after bending over to pick something up. She developed sharp pain in the right lower quadrant. She reports the location of the pain was the same as usual but it was slightly different in quality.     Past Medical History  Diagnosis Date  . Appendicitis   . UTI (urinary tract infection)     Patient Active Problem List   Diagnosis Date Noted  . Acute appendicitis 08/13/2014  . Depressive disorder 12/29/2011    Past Surgical History  Procedure Laterality Date  . Laparoscopic appendectomy N/A 08/13/2014    Procedure: APPENDECTOMY LAPAROSCOPIC;  Surgeon: Lattie Hawichard E Cooper, MD;  Location: [facility] ORS;  Service: General;  Laterality: N/A;    Current Outpatient Rx  Name  Route  Sig  Dispense  Refill  . HYDROcodone-acetaminophen (NORCO/VICODIN) 5-325 MG per tablet   Oral   Take 1-2 tablets by mouth every 4 (four) hours as needed for moderate pain.   30 tablet   0   . hydrOXYzine (ATARAX/VISTARIL) 10 MG tablet   Oral   Take 10 mg by mouth 3 (three) times daily as needed for anxiety.         . meloxicam (MOBIC) 15 MG tablet   Oral   Take 15 mg by mouth daily.         . mirtazapine (REMERON) 15 MG tablet   Oral   Take 15 mg by mouth at bedtime as needed (for sleep).           Allergies Review of patient's allergies indicates no known allergies.  Family  History  Problem Relation Age of Onset  . Cancer Mother     cervical  . Diabetes Father   . Diabetes Sister   . Cancer Maternal Grandmother   . Depression Paternal Grandmother   . Diabetes Paternal Grandfather     Social History Social History  Substance Use Topics  . Smoking status: Current Every Day Smoker -- 1.00 packs/day    Types: Cigarettes  . Smokeless tobacco: Never Used  . Alcohol Use: No    Review of Systems  Constitutional: Negative for fever. Eyes: Negative for visual changes. ENT: Negative for sore throat Cardiovascular: Negative for chest pain. Respiratory: Negative for shortness of breath. Gastrointestinal: Positive for right lower quadrant pain Genitourinary: Negative for dysuria. Musculoskeletal: Negative for back pain. Skin: Negative for rash. Neurological: Negative for headaches or focal weakness Psychiatric: No anxiety    ____________________________________________   PHYSICAL EXAM:  VITAL SIGNS: ED Triage Vitals  Enc Vitals Group     BP 11/09/14 1500 112/59 mmHg     Pulse Rate 11/09/14 1500 72     Resp 11/09/14 1500 20     Temp 11/09/14 1500 98.3 F (36.8 C)     Temp Source 11/09/14 1500 Oral     SpO2 11/09/14 1500 98 %     Weight 11/09/14 1500 135 lb (61.236 kg)  Height 11/09/14 1500  (1.651 m)     Head Cir --      Peak Flow --      Pain Score 11/09/14 1459 9     Pain Loc --      Pain Edu? --      Excl. in GC? --      Constitutional: Alert and oriented. Well appearing and in no distress. Eyes: Conjunctivae are normal.  ENT   Head: Normocephalic and atraumatic.   Mouth/Throat: Mucous membranes are moist. Cardiovascular: Normal rate, regular rhythm. Normal and symmetric distal pulses are present in all extremities. No murmurs, rubs, or gallops. Respiratory: Normal respiratory effort without tachypnea nor retractions. Breath sounds are clear and equal bilaterally.  Gastrointestinal: Mild tenderness to palpation in  the right lower quadrant. No distention. There is no CVA tenderness. Genitourinary: deferred Musculoskeletal: Nontender with normal range of motion in all extremities. No lower extremity tenderness nor edema. Neurologic:  Normal speech and language. No gross focal neurologic deficits are appreciated. Skin:  Skin is warm, dry and intact. No rash noted. Psychiatric: Mood and affect are normal. Patient exhibits appropriate insight and judgment.  ____________________________________________    LABS (pertinent positives/negatives)  Labs Reviewed  URINALYSIS COMPLETEWITH MICROSCOPIC ([facility] ONLY) - Abnormal; Notable for the following:    Color, Urine YELLOW (*)    APPearance CLOUDY (*)    Leukocytes, UA TRACE (*)    Bacteria, UA RARE (*)    Squamous Epithelial / LPF 6-30 (*)    All other components within normal limits  POCT PREGNANCY, URINE    ____________________________________________   EKG  None  ____________________________________________    RADIOLOGY I have personally reviewed any xrays that were ordered on this patient: None  ____________________________________________   PROCEDURES  Procedure(s) performed: none  Critical Care performed: none  ____________________________________________   INITIAL IMPRESSION / ASSESSMENT AND PLAN / ED COURSE  Pertinent labs & imaging results that were available during my care of the patient were reviewed by me and considered in my medical decision making (see chart for details).  Patient is at an extensive workup for this similar pain as today. She literally has surgery scheduled in morning to further evaluate this. We will treat her pain here in the emergency department and reevaluate   After Toradol 30 mg IM patient reports her pain is essentially resolved. She will follow-up with her surgeon in the morning. She knows to return if any change in her symptoms  ____________________________________________   FINAL CLINICAL  IMPRESSION(S) / ED DIAGNOSES  Final diagnoses:  Pelvic pain in female     Jene Every, MD 11/09/14 1729

## 2014-11-09 NOTE — ED Notes (Signed)
States she bent down to pick something up and felt pain to right sid of abd

## 2014-11-09 NOTE — ED Notes (Signed)
Pt states that today she bent over to pick something up and felt a tearing pain in her rlq, pt states that it hurts to bend and sit, pt states that it doesn't matter if she bends forward or backwards it is painful pt states that the pain is relieved if she is straight, lying flat or standing, pt is tender with light palpation at her rlq, pt does report some vaginal spotting

## 2014-11-10 ENCOUNTER — Encounter: Payer: Self-pay | Admitting: *Deleted

## 2014-11-10 ENCOUNTER — Ambulatory Visit: Payer: 59 | Admitting: Certified Registered Nurse Anesthetist

## 2014-11-10 ENCOUNTER — Encounter
Admission: RE | Disposition: A | Discharge status: Home / Self Care | Payer: Self-pay | Source: Ambulatory Visit | Attending: Obstetrics and Gynecology

## 2014-11-10 ENCOUNTER — Ambulatory Visit
Admission: RE | Admit: 2014-11-10 | Discharge: 2014-11-10 | Disposition: A | Discharge status: Home / Self Care | Payer: 59 | Source: Ambulatory Visit | Attending: Obstetrics and Gynecology | Admitting: Obstetrics and Gynecology

## 2014-11-10 DIAGNOSIS — N736 Female pelvic peritoneal adhesions (postinfective): Secondary | ICD-10-CM | POA: Diagnosis not present

## 2014-11-10 DIAGNOSIS — F17210 Nicotine dependence, cigarettes, uncomplicated: Secondary | ICD-10-CM | POA: Insufficient documentation

## 2014-11-10 DIAGNOSIS — G8929 Other chronic pain: Secondary | ICD-10-CM | POA: Diagnosis present

## 2014-11-10 DIAGNOSIS — F329 Major depressive disorder, single episode, unspecified: Secondary | ICD-10-CM | POA: Insufficient documentation

## 2014-11-10 DIAGNOSIS — R102 Pelvic and perineal pain: Secondary | ICD-10-CM | POA: Insufficient documentation

## 2014-11-10 HISTORY — PX: LAPAROSCOPY: SHX197

## 2014-11-10 LAB — POCT PREGNANCY, URINE: PREG TEST UR: NEGATIVE

## 2014-11-10 SURGERY — LAPAROSCOPY, DIAGNOSTIC
Anesthesia: General

## 2014-11-10 MED ORDER — SILVER NITRATE-POT NITRATE 75-25 % EX MISC
CUTANEOUS | Status: AC
  Filled 2014-11-10: qty 4

## 2014-11-10 MED ORDER — DEXAMETHASONE SODIUM PHOSPHATE 4 MG/ML IJ SOLN
INTRAMUSCULAR | Status: DC | PRN
  Administered 2014-11-10: 8 mg via INTRAVENOUS

## 2014-11-10 MED ORDER — ACETAMINOPHEN 10 MG/ML IV SOLN
INTRAVENOUS | Status: AC
Start: 2014-11-10 — End: 2014-11-10
  Filled 2014-11-10: qty 100

## 2014-11-10 MED ORDER — ONDANSETRON HCL 4 MG/2ML IJ SOLN: 4.0000 mg | Freq: Once | INTRAMUSCULAR | Status: DC | PRN

## 2014-11-10 MED ORDER — PROMETHAZINE HCL 25 MG/ML IJ SOLN
INTRAMUSCULAR | Status: AC
  Filled 2014-11-10: qty 1

## 2014-11-10 MED ORDER — LACTATED RINGERS IV SOLN
INTRAVENOUS | Status: DC
  Administered 2014-11-10: 10:00:00 via INTRAVENOUS

## 2014-11-10 MED ORDER — GLYCOPYRROLATE 0.2 MG/ML IJ SOLN
INTRAMUSCULAR | Status: DC | PRN
Start: 2014-11-10 — End: 2014-11-10
  Administered 2014-11-10 (×2): 0.1 mg via INTRAVENOUS
  Administered 2014-11-10: 0.4 mg via INTRAVENOUS

## 2014-11-10 MED ORDER — LIDOCAINE HCL (CARDIAC) 20 MG/ML IV SOLN
INTRAVENOUS | Status: DC | PRN
  Administered 2014-11-10: 60 mg via INTRAVENOUS

## 2014-11-10 MED ORDER — FENTANYL CITRATE (PF) 100 MCG/2ML IJ SOLN
INTRAMUSCULAR | Status: AC
  Administered 2014-11-10: 25 ug via INTRAVENOUS
  Filled 2014-11-10: qty 2

## 2014-11-10 MED ORDER — MIDAZOLAM HCL 2 MG/2ML IJ SOLN
INTRAMUSCULAR | Status: DC | PRN
  Administered 2014-11-10: 2 mg via INTRAVENOUS

## 2014-11-10 MED ORDER — PROPOFOL 10 MG/ML IV BOLUS
INTRAVENOUS | Status: DC | PRN
  Administered 2014-11-10: 150 mg via INTRAVENOUS

## 2014-11-10 MED ORDER — FAMOTIDINE 20 MG PO TABS
20.0000 mg | ORAL_TABLET | Freq: Once | ORAL | Status: AC
  Administered 2014-11-10: 20 mg via ORAL

## 2014-11-10 MED ORDER — NEOSTIGMINE METHYLSULFATE 10 MG/10ML IV SOLN
INTRAVENOUS | Status: DC | PRN
  Administered 2014-11-10: 3 mg via INTRAVENOUS

## 2014-11-10 MED ORDER — FENTANYL CITRATE (PF) 100 MCG/2ML IJ SOLN
INTRAMUSCULAR | Status: DC | PRN
  Administered 2014-11-10: 100 ug via INTRAVENOUS
  Administered 2014-11-10: 50 ug via INTRAVENOUS

## 2014-11-10 MED ORDER — KETOROLAC TROMETHAMINE 30 MG/ML IJ SOLN
INTRAMUSCULAR | Status: DC | PRN
  Administered 2014-11-10: 30 mg via INTRAVENOUS

## 2014-11-10 MED ORDER — HYDROCODONE-ACETAMINOPHEN 5-325 MG PO TABS: 1.0000 | ORAL_TABLET | Freq: Four times a day (QID) | ORAL | Status: DC | PRN

## 2014-11-10 MED ORDER — FENTANYL CITRATE (PF) 100 MCG/2ML IJ SOLN
25.0000 ug | INTRAMUSCULAR | Status: DC | PRN
  Administered 2014-11-10 (×4): 25 ug via INTRAVENOUS

## 2014-11-10 MED ORDER — ROCURONIUM BROMIDE 100 MG/10ML IV SOLN
INTRAVENOUS | Status: DC | PRN
  Administered 2014-11-10: 10 mg via INTRAVENOUS
  Administered 2014-11-10: 30 mg via INTRAVENOUS

## 2014-11-10 MED ORDER — FAMOTIDINE 20 MG PO TABS
ORAL_TABLET | ORAL | Status: AC
  Administered 2014-11-10: 20 mg via ORAL
  Filled 2014-11-10: qty 1

## 2014-11-10 MED ORDER — BUPIVACAINE HCL (PF) 0.5 % IJ SOLN
INTRAMUSCULAR | Status: AC
  Filled 2014-11-10: qty 30

## 2014-11-10 MED ORDER — ACETAMINOPHEN 10 MG/ML IV SOLN
INTRAVENOUS | Status: DC | PRN
  Administered 2014-11-10: 1000 mg via INTRAVENOUS

## 2014-11-10 MED ORDER — ONDANSETRON HCL 4 MG/2ML IJ SOLN
INTRAMUSCULAR | Status: DC | PRN
  Administered 2014-11-10: 4 mg via INTRAVENOUS

## 2014-11-10 MED ORDER — FLEET ENEMA 7-19 GM/118ML RE ENEM: 1.0000 | ENEMA | Freq: Once | RECTAL | Status: DC

## 2014-11-10 MED ORDER — BUPIVACAINE HCL 0.5 % IJ SOLN
INTRAMUSCULAR | Status: DC | PRN
  Administered 2014-11-10: 6.5 mL

## 2014-11-10 SURGICAL SUPPLY — 36 items
BLADE SURG SZ11 CARB STEEL (BLADE) ×3 IMPLANT
CANISTER SUCT 1200ML W/VALVE (MISCELLANEOUS) ×3 IMPLANT
CATH FOLEY 2WAY  5CC 16FR (CATHETERS)
CATH ROBINSON RED A/P 16FR (CATHETERS) ×3 IMPLANT
CATH URTH 16FR FL 2W BLN LF (CATHETERS) IMPLANT
CHLORAPREP W/TINT 26ML (MISCELLANEOUS) ×3 IMPLANT
CLOSURE WOUND 1/2 X4 (GAUZE/BANDAGES/DRESSINGS) ×1
DRSG TEGADERM 2-3/8X2-3/4 SM (GAUZE/BANDAGES/DRESSINGS) ×6 IMPLANT
GAUZE SPONGE NON-WVN 2X2 STRL (MISCELLANEOUS) ×2 IMPLANT
GLOVE BIO SURGEON STRL SZ8 (GLOVE) ×12 IMPLANT
GOWN STRL REUS W/ TWL LRG LVL3 (GOWN DISPOSABLE) ×1 IMPLANT
GOWN STRL REUS W/ TWL XL LVL3 (GOWN DISPOSABLE) ×1 IMPLANT
GOWN STRL REUS W/TWL LRG LVL3 (GOWN DISPOSABLE) ×2
GOWN STRL REUS W/TWL XL LVL3 (GOWN DISPOSABLE) ×2
IRRIGATION STRYKERFLOW (MISCELLANEOUS) IMPLANT
IRRIGATOR STRYKERFLOW (MISCELLANEOUS)
IV NS 1000ML (IV SOLUTION)
IV NS 1000ML BAXH (IV SOLUTION) IMPLANT
KIT RM TURNOVER CYSTO AR (KITS) ×3 IMPLANT
LABEL OR SOLS (LABEL) ×3 IMPLANT
NS IRRIG 500ML POUR BTL (IV SOLUTION) ×3 IMPLANT
PACK GYN LAPAROSCOPIC (MISCELLANEOUS) ×3 IMPLANT
PAD OB MATERNITY 4.3X12.25 (PERSONAL CARE ITEMS) ×3 IMPLANT
PAD PREP 24X41 OB/GYN DISP (PERSONAL CARE ITEMS) ×3 IMPLANT
SCISSORS METZENBAUM CVD 33 (INSTRUMENTS) ×3 IMPLANT
SHEARS HARMONIC ACE PLUS 36CM (ENDOMECHANICALS) IMPLANT
SLEEVE ENDOPATH XCEL 5M (ENDOMECHANICALS) ×3 IMPLANT
SPONGE VERSALON 2X2 STRL (MISCELLANEOUS) ×4
STRIP CLOSURE SKIN 1/2X4 (GAUZE/BANDAGES/DRESSINGS) ×2 IMPLANT
SUT VIC AB 2-0 UR6 27 (SUTURE) ×3 IMPLANT
SUT VIC AB 4-0 SH 27 (SUTURE) ×2
SUT VIC AB 4-0 SH 27XANBCTRL (SUTURE) ×1 IMPLANT
SWABSTK COMLB BENZOIN TINCTURE (MISCELLANEOUS) ×3 IMPLANT
TROCAR ENDO BLADELESS 11MM (ENDOMECHANICALS) ×3 IMPLANT
TROCAR XCEL NON-BLD 5MMX100MML (ENDOMECHANICALS) IMPLANT
TUBING INSUFFLATOR HI FLOW (MISCELLANEOUS) ×3 IMPLANT

## 2014-11-10 NOTE — Brief Op Note (Signed)
11/10/2014  11:33 AM  PATIENT:  Nancy Koch  19 y.o. female  PRE-OPERATIVE DIAGNOSIS:  CHRONIC PELVIC PAIN  POST-OPERATIVE DIAGNOSIS:  CHRONIC PELVIC PAIN,   PROCEDURE:  Procedure(s): LAPAROSCOPY DIAGNOSTIC (N/A)  Lysis of adhesions   SURGEON:  Surgeon(s) and Role:    Suzy Bouchard* Thomas J Schermerhorn, MD - Primary  PHYSICIAN ASSISTANT:   ASSISTANTS: none   ANESTHESIA:   general  EBL:  Total I/O In: -  Out: 110 [Urine:100; Blood:10]  BLOOD ADMINISTERED:none  DRAINS: none   LOCAL MEDICATIONS USED:  MARCAINE     SPECIMEN:  No Specimen  DISPOSITION OF SPECIMEN:  N/A  COUNTS:  YES  TOURNIQUET:  * No tourniquets in log *  DICTATION: .Other Dictation: Dictation Number verbal  PLAN OF CARE: Discharge to home after PACU  PATIENT DISPOSITION:  PACU - hemodynamically stable.   Delay start of Pharmacological VTE agent (>24hrs) due to surgical blood loss or risk of bleeding: not applicable

## 2014-11-10 NOTE — Progress Notes (Signed)
Pt sen thia am . Increasing pain yesterday with ED visit . Pain management only . Pain better today . Scheduled for dx L/S today . NPO and enema completed . Neg HCG . All questions answered .

## 2014-11-10 NOTE — Anesthesia Postprocedure Evaluation (Signed)
  Anesthesia Post-op Note  Patient: Nancy Koch  Procedure(s) Performed: Procedure(s): LAPAROSCOPY DIAGNOSTIC (N/A)  Anesthesia type:General  Patient location: PACU  Post pain: Pain level controlled  Post assessment: Post-op Vital signs reviewed, Patient's Cardiovascular Status Stable, Respiratory Function Stable, Patent Airway and No signs of Nausea or vomiting  Post vital signs: Reviewed and stable  Last Vitals:  Filed Vitals:   11/10/14 1200  BP: 117/75  Pulse: 85  Temp:   Resp: 19    Level of consciousness: awake, alert  and patient cooperative  Complications: No apparent anesthesia complications

## 2014-11-10 NOTE — Anesthesia Preprocedure Evaluation (Signed)
Anesthesia Evaluation  Patient identified by MRN, date of birth, ID band Patient awake    Reviewed: Allergy & Precautions, NPO status , Patient's Chart, lab work & pertinent test results  History of Anesthesia Complications Negative for: history of anesthetic complications  Airway Mallampati: I       Dental  (+) Teeth Intact   Pulmonary Current Smoker,           Cardiovascular negative cardio ROS       Neuro/Psych Depression negative neurological ROS     GI/Hepatic Neg liver ROS,   Endo/Other  negative endocrine ROS  Renal/GU negative Renal ROS     Musculoskeletal negative musculoskeletal ROS (+)   Abdominal   Peds  Hematology negative hematology ROS (+)   Anesthesia Other Findings   Reproductive/Obstetrics                             Anesthesia Physical Anesthesia Plan  ASA: II  Anesthesia Plan: General   Post-op Pain Management:    Induction: Intravenous  Airway Management Planned: Oral ETT  Additional Equipment:   Intra-op Plan:   Post-operative Plan:   Informed Consent: I have reviewed the patients History and Physical, chart, labs and discussed the procedure including the risks, benefits and alternatives for the proposed anesthesia with the patient or authorized representative who has indicated his/her understanding and acceptance.     Plan Discussed with:   Anesthesia Plan Comments:         Anesthesia Quick Evaluation

## 2014-11-10 NOTE — Transfer of Care (Signed)
Immediate Anesthesia Transfer of Care Note  Patient: Nancy Koch  Procedure(s) Performed: Procedure(s): LAPAROSCOPY DIAGNOSTIC (N/A)  Patient Location: PACU  Anesthesia Type:General  Level of Consciousness: sedated  Airway & Oxygen Therapy: Patient Spontanous Breathing and Patient connected to face mask oxygen  Post-op Assessment: Report given to RN and Post -op Vital signs reviewed and stable  Post vital signs: Reviewed and stable  Last Vitals:  Filed Vitals:   11/10/14 1145  BP: 105/56  Pulse: 64  Temp: 36 C  Resp: 10    Complications: No apparent anesthesia complications

## 2014-11-11 NOTE — Op Note (Signed)
[name], Nancy                ACCOUNT NO.:  [id number]  MEDICAL RECORD NO.:  [id number]  LOCATION:  [facility]                         FACILITY:  [facility]  PHYSICIAN:  Jennell Cornerhomas Schermerhorn, MDDATE OF BIRTH:  November 26, 1995  DATE OF PROCEDURE:  11/10/2014 DATE OF DISCHARGE:  11/10/2014                              OPERATIVE REPORT   PREOPERATIVE DIAGNOSIS:  Chronic pelvic pain.  POSTOPERATIVE DIAGNOSIS: 1. Chronic pelvic pain. 2. Pelvic adhesions.  PROCEDURE: 1. Diagnostic laparoscopy. 2. Lysis of adhesions.  ANESTHESIA:  General endotracheal anesthesia.  SURGEON:  Jennell Cornerhomas Schermerhorn, MD.  INDICATIONS:  A 19 year old, gravida 0 patient with a 2-year history of chronic pelvic pain, right sided.  The patient is status post an appendectomy 3 months prior.  DESCRIPTION OF PROCEDURE:  After adequate general endotracheal anesthesia, the patient was placed in dorsal supine position.  The patient's abdomen and vagina were prepped and draped normal sterile fashion.  A speculum was placed in the vagina, and the anterior cervix was grasped with a single-tooth tenaculum, and a Kahn cannula was placed into the endocervical canal to be used for uterine manipulation during the procedure.  Gloves were changed.  A 12-mm infraumbilical incision was made after injected with 0.5% Marcaine.  The laparoscope was advanced into the abdominal cavity under direct visualization.  The patient's abdomen was insufflated.  The patient was placed in Trendelenburg.  A second port site was placed at the left lower quadrant 3 cm medial to the left anterior iliac spine and under direct visualization.  This trocar was advanced.  Through this, atraumatic grasper was placed.  Initial evaluation of the pelvis revealed some filmy adhesions on the right abdominal sidewall, midportion of the abdomen and some filmy adhesions at the prior appendectomy site.  There was also a small adhesion from the left sidewall.  These  filmy adhesions were taken down sharply.  The anterior and posterior cul-de-sac appeared normal.  The uterus appeared normal.  Fallopian tubes and ovaries appeared normal.  Otherwise, ureters appeared normal.  There was no evidence of endometriosis.  Upper abdomen appeared normal.  The patient's abdomen was deflated, and the infraumbilical incision was closed with a fascial layer using 2-0 Vicryl suture and the other skin incisions were closed with interrupted 4-0 Vicryl suture, and the LiquiBand was placed over the skin and a Tegaderm was placed over this. The single-tooth tenaculum was removed, and the Kahn cannula was removed. Good hemostasis was noted.  There were no complications.  Estimated blood loss minimal.  Intraop fluids 800 mL, and straight catheterization of the bladder prior to commencement of the case yielded 100 mL of clear urine.  The patient was taken to the recovery room in good condition.          ______________________________ Jennell Cornerhomas Schermerhorn, MD     TS/MEDQ  D:  11/10/2014  T:  11/11/2014  Job:  [id number]

## 2014-11-12 LAB — ABO/RH: ABO/RH(D): O NEG

## 2015-01-11 NOTE — L&D Delivery Note (Signed)
Delivery Note At 2:31 PM on 10/11/15  a viable female was delivered via Vaginal, Spontaneous Delivery (Presentation: ;LOA  ).  APGAR: 8, 9; weight 8 lb 4.6 oz (3760 g).   Placenta status: intact , .  Cord:3 V   with the following complications none : .  Anesthesia:  CLE Episiotomy:  MLE Lacerations:  None  Suture Repair: 2.0 3.0 vicryl Est. Blood Loss (mL):    Mom to postpartum.  Baby to Couplet care / Skin to Skin.  SCHERMERHORN,THOMAS 10/11/2015, 2:57 PM

## 2015-03-18 LAB — OB RESULTS CONSOLE RUBELLA ANTIBODY, IGM: Rubella: IMMUNE

## 2015-03-18 LAB — OB RESULTS CONSOLE HEPATITIS B SURFACE ANTIGEN: Hepatitis B Surface Ag: NEGATIVE

## 2015-03-18 LAB — OB RESULTS CONSOLE VARICELLA ZOSTER ANTIBODY, IGG: Varicella: IMMUNE

## 2015-04-20 ENCOUNTER — Emergency Department: Payer: Medicaid Other

## 2015-04-20 ENCOUNTER — Encounter: Payer: Self-pay | Admitting: Emergency Medicine

## 2015-04-20 ENCOUNTER — Emergency Department
Admission: EM | Admit: 2015-04-20 | Discharge: 2015-04-20 | Disposition: A | Discharge status: Home / Self Care | Payer: Medicaid Other | Attending: Emergency Medicine | Admitting: Emergency Medicine

## 2015-04-20 DIAGNOSIS — R1031 Right lower quadrant pain: Secondary | ICD-10-CM | POA: Insufficient documentation

## 2015-04-20 DIAGNOSIS — Z3492 Encounter for supervision of normal pregnancy, unspecified, second trimester: Secondary | ICD-10-CM

## 2015-04-20 DIAGNOSIS — F17210 Nicotine dependence, cigarettes, uncomplicated: Secondary | ICD-10-CM | POA: Insufficient documentation

## 2015-04-20 DIAGNOSIS — O26892 Other specified pregnancy related conditions, second trimester: Secondary | ICD-10-CM | POA: Diagnosis present

## 2015-04-20 DIAGNOSIS — F329 Major depressive disorder, single episode, unspecified: Secondary | ICD-10-CM | POA: Diagnosis not present

## 2015-04-20 DIAGNOSIS — Z3A14 14 weeks gestation of pregnancy: Secondary | ICD-10-CM | POA: Insufficient documentation

## 2015-04-20 LAB — COMPREHENSIVE METABOLIC PANEL
ALBUMIN: 4.5 g/dL (ref 3.5–5.0)
ALT: 20 U/L (ref 14–54)
ANION GAP: 7 (ref 5–15)
AST: 25 U/L (ref 15–41)
Alkaline Phosphatase: 57 U/L (ref 38–126)
BUN: 7 mg/dL (ref 6–20)
CHLORIDE: 107 mmol/L (ref 101–111)
CO2: 18 mmol/L — AB (ref 22–32)
Calcium: 9.7 mg/dL (ref 8.9–10.3)
Creatinine, Ser: 0.47 mg/dL (ref 0.44–1.00)
GFR calc Af Amer: >60 mL/min (ref >60)
GFR calc non Af Amer: >60 mL/min (ref >60)
GLUCOSE: 88 mg/dL (ref 65–99)
POTASSIUM: 3.4 mmol/L — AB (ref 3.5–5.1)
SODIUM: 132 mmol/L — AB (ref 135–145)
Total Bilirubin: 0.5 mg/dL (ref 0.3–1.2)
Total Protein: 8 g/dL (ref 6.5–8.1)

## 2015-04-20 LAB — URINALYSIS COMPLETE WITH MICROSCOPIC (ARMC ONLY)
BILIRUBIN URINE: NEGATIVE
Glucose, UA: NEGATIVE mg/dL
Hgb urine dipstick: NEGATIVE
Leukocytes, UA: NEGATIVE
NITRITE: NEGATIVE
PROTEIN: NEGATIVE mg/dL
SPECIFIC GRAVITY, URINE: 1.018 (ref 1.005–1.030)
pH: 9 — ABNORMAL HIGH (ref 5.0–8.0)

## 2015-04-20 LAB — CBC
HEMATOCRIT: 35.5 % (ref 35.0–47.0)
HEMOGLOBIN: 12.4 g/dL (ref 12.0–16.0)
MCH: 29.8 pg (ref 26.0–34.0)
MCHC: 35 g/dL (ref 32.0–36.0)
MCV: 85 fL (ref 80.0–100.0)
PLATELETS: 198 10*3/uL (ref 150–440)
RBC: 4.17 MIL/uL (ref 3.80–5.20)
RDW: 13.4 % (ref 11.5–14.5)
WBC: 8.7 10*3/uL (ref 3.6–11.0)

## 2015-04-20 LAB — LIPASE, BLOOD: Lipase: 29 U/L (ref 11–51)

## 2015-04-20 LAB — PREGNANCY, URINE: Preg Test, Ur: POSITIVE — AB

## 2015-04-20 MED ORDER — NITROFURANTOIN MACROCRYSTAL 100 MG PO CAPS: 100.0000 mg | ORAL_CAPSULE | Freq: Two times a day (BID) | ORAL | Status: DC

## 2015-04-20 NOTE — ED Notes (Signed)
C/o right side pain x 2 years, today pain is worse.  Patient has had medical evaluation for pain, no diagnosis.  States "I can't deal with it any more".  C/O right lower quadrant pain.

## 2015-04-20 NOTE — ED Provider Notes (Signed)
[facility] Emergency Department Provider Note  ____________________________________________  Time seen: 4:00 PM  I have reviewed the triage vital signs and the nursing notes.   HISTORY  Chief Complaint Abdominal Pain    HPI Nancy Koch is a 20 y.o. female who complains of chronic right lower quadrant abdominal pain for the past 2 years. Today it is worse. It's colicky lasting a few minutes at a time, nonradiating. Severe intensity when present, currently resolved. Sharp and cramping. No vaginal bleeding contractions or leakage of fluid. Uncomplicated 15 week pregnancy at present time, currently following up with the health Department for planning to get transferred to Sc[facility]mer[facility] who has operated her in the past. She's had extensive workup for this chronic abdominal pain including previous appendectomy, ultrasounds, diagnostic laparoscopy. Denies dysuria frequency urgency.     Past Medical History  Diagnosis Date  . Appendicitis   . UTI (urinary tract infection)      Patient Active Problem List   Diagnosis Date Noted  . Acute appendicitis 08/13/2014  . Depressive disorder 12/29/2011     Past Surgical History  Procedure Laterality Date  . Laparoscopic appendectomy N/A 08/13/2014    Procedure: APPENDECTOMY LAPAROSCOPIC;  Surgeon: Lattie Hawichard E Cooper, MD;  Location: [facility] ORS;  Service: General;  Laterality: N/A;  . Laparoscopy N/A 11/10/2014    Procedure: LAPAROSCOPY DIAGNOSTIC;  Surgeon: Suzy Bouchardhomas J Schermerhorn, MD;  Location: [facility] ORS;  Service: Gynecology;  Laterality: N/A;  . Appendectomy       Current Outpatient Rx  Name  Route  Sig  Dispense  Refill  . HYDROcodone-acetaminophen (NORCO/VICODIN) 5-325 MG tablet   Oral   Take 1-2 tablets by mouth 4 (four) times daily as needed for moderate pain.   30 tablet   0   . hydrOXYzine (ATARAX/VISTARIL) 10 MG tablet   Oral   Take 10 mg by mouth 3 (three) times daily as needed for anxiety.          . meloxicam (MOBIC) 15 MG tablet   Oral   Take 15 mg by mouth daily.         . mirtazapine (REMERON) 15 MG tablet   Oral   Take 15 mg by mouth at bedtime as needed (for sleep).            Allergies Review of patient's allergies indicates no known allergies.   Family History  Problem Relation Age of Onset  . Cancer Mother     cervical  . Diabetes Father   . Diabetes Sister   . Cancer Maternal Grandmother   . Depression Paternal Grandmother   . Diabetes Paternal Grandfather     Social History Social History  Substance Use Topics  . Smoking status: Current Every Day Smoker -- 1.00 packs/day    Types: Cigarettes  . Smokeless tobacco: Never Used  . Alcohol Use: No    Review of Systems  Constitutional:   No fever or chills.  Eyes:   No vision changes.  ENT:   No sore throat. No rhinorrhea. Cardiovascular:   No chest pain. Respiratory:   No dyspnea or cough. Gastrointestinal:   Acute on chronic right lower quadrant abdominal pain without vomiting or diarrhea. Eating normally..  No bloody stool. Genitourinary:   Negative for dysuria or difficulty urinating. Musculoskeletal:   Negative for focal pain or swelling Neurological:   Negative for headaches 10-point ROS otherwise negative.  ____________________________________________   PHYSICAL EXAM:  VITAL SIGNS: ED Triage Vitals  Enc Vitals Group  BP 04/20/15 1431 101/70 mmHg     Pulse Rate 04/20/15 1431 76     Resp 04/20/15 1431 18     Temp 04/20/15 1431 97.5 F (36.4 C)     Temp Source 04/20/15 1431 Oral     SpO2 04/20/15 1431 100 %     Weight 04/20/15 1431 136 lb (61.689 kg)     Height 04/20/15 1431  (1.676 m)     Head Cir --      Peak Flow --      Pain Score 04/20/15 1432 10     Pain Loc --      Pain Edu? --      Excl. in GC? --     Vital signs reviewed, nursing assessments reviewed.   Constitutional:   Alert and oriented. Well appearing and in no distress. Eyes:   No scleral icterus.  No conjunctival pallor. PERRL. EOMI ENT   Head:   Normocephalic and atraumatic.   Nose:   No congestion/rhinnorhea. No septal hematoma   Mouth/Throat:   MMM, no pharyngeal erythema. No peritonsillar mass.    Neck:   No stridor. No SubQ emphysema. No meningismus. Hematological/Lymphatic/Immunilogical:   No cervical lymphadenopathy. Cardiovascular:   RRR. Symmetric bilateral radial and DP pulses.  No murmurs.  Respiratory:   Normal respiratory effort without tachypnea nor retractions. Breath sounds are clear and equal bilaterally. No wheezes/rales/rhonchi. Gastrointestinal:   SoftWith right lower quadrant tenderness. No rebound or guarding.. Non distended. There is no CVA tenderness.  No rebound, rigidity, or guarding. Genitourinary:   deferred Musculoskeletal:   Nontender with normal range of motion in all extremities. No joint effusions.  No lower extremity tenderness.  No edema. Neurologic:   Normal speech and language.  CN 2-10 normal. Motor grossly intact. No gross focal neurologic deficits are appreciated.  Skin:    Skin is warm, dry and intact. No rash noted.  No petechiae, purpura, or bullae.  ____________________________________________    LABS (pertinent positives/negatives) (all labs ordered are listed, but only abnormal results are displayed) Labs Reviewed  COMPREHENSIVE METABOLIC PANEL - Abnormal; Notable for the following:    Sodium 132 (*)    Potassium 3.4 (*)    CO2 18 (*)    All other components within normal limits  URINALYSIS COMPLETEWITH MICROSCOPIC ([facility] ONLY) - Abnormal; Notable for the following:    Color, Urine YELLOW (*)    APPearance CLOUDY (*)    Ketones, ur 2+ (*)    pH 9.0 (*)    Bacteria, UA MANY (*)    Squamous Epithelial / LPF 0-5 (*)    All other components within normal limits  PREGNANCY, URINE - Abnormal; Notable for the following:    Preg Test, Ur POSITIVE (*)    All other components within normal limits  URINE CULTURE  LIPASE,  BLOOD  CBC   ____________________________________________   EKG    ____________________________________________    RADIOLOGY  Ultrasound pelvis reveals uncomplicated pregnancy, size consistent with dates. Left ovary not visualized. Right ovary normal in appearance with normal color Doppler flow.  ____________________________________________   PROCEDURES   ____________________________________________   INITIAL IMPRESSION / ASSESSMENT AND PLAN / ED COURSE  Pertinent labs & imaging results that were available during my care of the patient were reviewed by me and considered in my medical decision making (see chart for details).  Patient complains of acute on chronic right lower quadrant abdominal pain. By history this actually seems to be round ligament pain. However  like to evaluate and make sure she does not have an ovarian cyst is going to be putting her at risk for torsion. Low suspicion for torsion at present time as the patient is currently pain-free and comfortable. Also found to have asymptomatic bacteriuria and will start her on Macrobid. Urine culture sent. Follow-up with obstetrics for further care. Patient is tolerating oral intake in the emergency department.     ____________________________________________   FINAL CLINICAL IMPRESSION(S) / ED DIAGNOSES  Final diagnoses:  Colicky RLQ abdominal pain  Second trimester pregnancy       Portions of this note were generated with dragon dictation software. Dictation errors may occur despite best attempts at proofreading.   Sharman Cheek, MD 04/20/15 [phone]

## 2015-04-20 NOTE — Discharge Instructions (Signed)
Abdominal Pain, Adult Many things can cause abdominal pain. Usually, abdominal pain is not caused by a disease and will improve without treatment. It can often be observed and treated at home. Your health care provider will do a physical exam and possibly order blood tests and X-rays to help determine the seriousness of your pain. However, in many cases, more time must pass before a clear cause of the pain can be found. Before that point, your health care provider may not know if you need more testing or further treatment. HOME CARE INSTRUCTIONS Monitor your abdominal pain for any changes. The following actions may help to alleviate any discomfort you are experiencing:  Only take over-the-counter or prescription medicines as directed by your health care provider.  Do not take laxatives unless directed to do so by your health care provider.  Try a clear liquid diet (broth, tea, or water) as directed by your health care provider. Slowly move to a bland diet as tolerated. SEEK MEDICAL CARE IF:  You have unexplained abdominal pain.  You have abdominal pain associated with nausea or diarrhea.  You have pain when you urinate or have a bowel movement.  You experience abdominal pain that wakes you in the night.  You have abdominal pain that is worsened or improved by eating food.  You have abdominal pain that is worsened with eating fatty foods.  You have a fever. SEEK IMMEDIATE MEDICAL CARE IF:  Your pain does not go away within 2 hours.  You keep throwing up (vomiting).  Your pain is felt only in portions of the abdomen, such as the right side or the left lower portion of the abdomen.  You pass bloody or black tarry stools. MAKE SURE YOU:  Understand these instructions.  Will watch your condition.  Will get help right away if you are not doing well or get worse.   This information is not intended to replace advice given to you by your health care provider. Make sure you discuss  any questions you have with your health care provider.   Document Released: 10/06/2004 Document Revised: 09/17/2014 Document Reviewed: 09/05/2012 Elsevier Interactive Patient Education 2016 Elsevier Inc.  Round Ligament Pain The round ligament is a cord of muscle and tissue that helps to support the uterus. It can become a source of pain during pregnancy if it becomes stretched or twisted as the baby grows. The pain usually begins in the second trimester of pregnancy, and it can come and go until the baby is delivered. It is not a serious problem, and it does not cause harm to the baby. Round ligament pain is usually a short, sharp, and pinching pain, but it can also be a dull, lingering, and aching pain. The pain is felt in the lower side of the abdomen or in the groin. It usually starts deep in the groin and moves up to the outside of the hip area. Pain can occur with:  A sudden change in position.  Rolling over in bed.  Coughing or sneezing.  Physical activity. HOME CARE INSTRUCTIONS Watch your condition for any changes. Take these steps to help with your pain:  When the pain starts, relax. Then try:  Sitting down.  Flexing your knees up to your abdomen.  Lying on your side with one pillow under your abdomen and another pillow between your legs.  Sitting in a warm bath for 15-20 minutes or until the pain goes away.  Take over-the-counter and prescription medicines only as told by  your health care provider.  Move slowly when you sit and stand.  Avoid long walks if they cause pain.  Stop or lessen your physical activities if they cause pain. SEEK MEDICAL CARE IF:  Your pain does not go away with treatment.  You feel pain in your back that you did not have before.  Your medicine is not helping. SEEK IMMEDIATE MEDICAL CARE IF:  You develop a fever or chills.  You develop uterine contractions.  You develop vaginal bleeding.  You develop nausea or vomiting.  You  develop diarrhea.  You have pain when you urinate.   This information is not intended to replace advice given to you by your health care provider. Make sure you discuss any questions you have with your health care provider.   Document Released: 10/06/2007 Document Revised: 03/21/2011 Document Reviewed: 03/05/2014 Elsevier Interactive Patient Education [employer]2016 Elsevier Inc.

## 2015-04-20 NOTE — ED Notes (Signed)
Pt reports right sided abdominal pain x2 years that got worse since Staurday, states did multiple tests including abdominal CT, US, and exploratory surgery without findings. Pt is about [redacted] weeks pregnant.

## 2015-04-22 LAB — URINE CULTURE

## 2015-05-07 LAB — OB RESULTS CONSOLE HIV ANTIBODY (ROUTINE TESTING): HIV: NONREACTIVE

## 2015-06-25 DIAGNOSIS — Z6791 Unspecified blood type, Rh negative: Secondary | ICD-10-CM | POA: Insufficient documentation

## 2015-09-18 LAB — OB RESULTS CONSOLE GBS: GBS: NEGATIVE

## 2015-09-28 ENCOUNTER — Inpatient Hospital Stay
Admission: EM | Admit: 2015-09-28 | Discharge: 2015-09-28 | Disposition: A | Discharge status: Home / Self Care | Payer: Medicaid Other | Attending: Obstetrics and Gynecology | Admitting: Obstetrics and Gynecology

## 2015-09-28 DIAGNOSIS — O26853 Spotting complicating pregnancy, third trimester: Secondary | ICD-10-CM | POA: Insufficient documentation

## 2015-09-28 DIAGNOSIS — Z3A36 36 weeks gestation of pregnancy: Secondary | ICD-10-CM | POA: Insufficient documentation

## 2015-09-28 NOTE — OB Triage Note (Signed)
Patient came in for observation for vaginal bleeding. Patient denies uterine contractions and has been feeling the baby move fine. Patient denies leaking of fluid but has complaint of vaginal bleeding and spotting that started yesterday evening. Patient states she has not soaked or had to change peri pad since the bleeding started yesterday. Vital signs stable and patient afebrile. FHR baseline 135 with moderate variability with accelerations 15 x 15 and no decelerations. Husband at bedside. Will continue to monitor.

## 2015-09-28 NOTE — Discharge Summary (Signed)
Patient discharged with instructions when to seek medical attention, follow up appointment, vaginal bleeding precautions and concerns. Patient ambulatory at discharge with steady gait and no complaints. Patient discharged with husband. Patient verbalized understanding of discharged instructions.

## 2015-09-28 NOTE — Discharge Summary (Signed)
  Nancy LameKelsie J Koch Jan 13, 1995 [id number] presents for vaginal spotting  noLOF , O;BP 122/73 (BP Location: Left Arm)   Pulse 79   Temp 98.3 F (36.8 C) (Oral)   Resp 18   Ht 5\' 6"  (1.676 m)   Wt 160 lb (72.6 kg)   LMP 10/28/2014   BMI 25.82 kg/m   CX closed , no blood on glove by RN  NSTreactive  A:vaginal spotting, no labor , reassuring fetal monitoring  P:d/c home with precautions

## 2015-10-11 ENCOUNTER — Inpatient Hospital Stay: Payer: Medicaid Other | Admitting: Anesthesiology

## 2015-10-11 ENCOUNTER — Inpatient Hospital Stay
Admission: EM | Admit: 2015-10-11 | Discharge: 2015-10-13 | DRG: 775 | Disposition: A | Discharge status: Home / Self Care | Payer: Medicaid Other | Attending: Obstetrics and Gynecology | Admitting: Obstetrics and Gynecology

## 2015-10-11 ENCOUNTER — Encounter: Payer: Self-pay | Admitting: *Deleted

## 2015-10-11 DIAGNOSIS — Z87891 Personal history of nicotine dependence: Secondary | ICD-10-CM

## 2015-10-11 DIAGNOSIS — Z833 Family history of diabetes mellitus: Secondary | ICD-10-CM

## 2015-10-11 DIAGNOSIS — O9081 Anemia of the puerperium: Secondary | ICD-10-CM | POA: Diagnosis present

## 2015-10-11 DIAGNOSIS — Z3A38 38 weeks gestation of pregnancy: Secondary | ICD-10-CM

## 2015-10-11 DIAGNOSIS — O4202 Full-term premature rupture of membranes, onset of labor within 24 hours of rupture: Secondary | ICD-10-CM | POA: Diagnosis present

## 2015-10-11 DIAGNOSIS — D62 Acute posthemorrhagic anemia: Secondary | ICD-10-CM | POA: Diagnosis present

## 2015-10-11 LAB — CBC
HCT: 29.7 % — ABNORMAL LOW (ref 35.0–47.0)
Hemoglobin: 10.1 g/dL — ABNORMAL LOW (ref 12.0–16.0)
MCH: 26.8 pg (ref 26.0–34.0)
MCHC: 33.9 g/dL (ref 32.0–36.0)
MCV: 79.3 fL — ABNORMAL LOW (ref 80.0–100.0)
Platelets: 165 10*3/uL (ref 150–440)
RBC: 3.75 MIL/uL — ABNORMAL LOW (ref 3.80–5.20)
RDW: 14 % (ref 11.5–14.5)
WBC: 11.2 10*3/uL — ABNORMAL HIGH (ref 3.6–11.0)

## 2015-10-11 LAB — TYPE AND SCREEN
ABO/RH(D): O NEG
Antibody Screen: NEGATIVE

## 2015-10-11 MED ORDER — ACETAMINOPHEN 325 MG PO TABS: 650.0000 mg | ORAL_TABLET | ORAL | Status: DC | PRN

## 2015-10-11 MED ORDER — BUTORPHANOL TARTRATE 1 MG/ML IJ SOLN
INTRAMUSCULAR | Status: AC
  Administered 2015-10-11: 2 mg via INTRAVENOUS
  Filled 2015-10-11: qty 2

## 2015-10-11 MED ORDER — OXYCODONE-ACETAMINOPHEN 5-325 MG PO TABS: 2.0000 | ORAL_TABLET | ORAL | Status: DC | PRN

## 2015-10-11 MED ORDER — KETOROLAC TROMETHAMINE 30 MG/ML IJ SOLN: 30.0000 mg | Freq: Four times a day (QID) | INTRAMUSCULAR | Status: AC | PRN

## 2015-10-11 MED ORDER — LACTATED RINGERS IV SOLN: 500.0000 mL | INTRAVENOUS | Status: DC | PRN

## 2015-10-11 MED ORDER — ONDANSETRON HCL 4 MG/2ML IJ SOLN: 4.0000 mg | Freq: Three times a day (TID) | INTRAMUSCULAR | Status: DC | PRN

## 2015-10-11 MED ORDER — NALOXONE HCL 2 MG/2ML IJ SOSY
1.0000 ug/kg/h | PREFILLED_SYRINGE | INTRAMUSCULAR | Status: DC | PRN
  Filled 2015-10-11: qty 2

## 2015-10-11 MED ORDER — HYDROCODONE-ACETAMINOPHEN 5-325 MG PO TABS
1.0000 | ORAL_TABLET | Freq: Four times a day (QID) | ORAL | Status: DC | PRN
  Administered 2015-10-12: 1 via ORAL
  Filled 2015-10-11: qty 1

## 2015-10-11 MED ORDER — SIMETHICONE 80 MG PO CHEW: 80.0000 mg | CHEWABLE_TABLET | ORAL | Status: DC | PRN

## 2015-10-11 MED ORDER — DIPHENHYDRAMINE HCL 25 MG PO CAPS: 25.0000 mg | ORAL_CAPSULE | ORAL | Status: DC | PRN

## 2015-10-11 MED ORDER — NALBUPHINE HCL 10 MG/ML IJ SOLN: 5.0000 mg | INTRAMUSCULAR | Status: DC | PRN

## 2015-10-11 MED ORDER — ONDANSETRON HCL 4 MG/2ML IJ SOLN: 4.0000 mg | Freq: Four times a day (QID) | INTRAMUSCULAR | Status: DC | PRN

## 2015-10-11 MED ORDER — NALBUPHINE HCL 10 MG/ML IJ SOLN: 5.0000 mg | Freq: Once | INTRAMUSCULAR | Status: DC | PRN

## 2015-10-11 MED ORDER — SODIUM CHLORIDE 0.9% FLUSH: 3.0000 mL | INTRAVENOUS | Status: DC | PRN

## 2015-10-11 MED ORDER — OXYCODONE-ACETAMINOPHEN 5-325 MG PO TABS: 1.0000 | ORAL_TABLET | ORAL | Status: DC | PRN

## 2015-10-11 MED ORDER — FENTANYL 2.5 MCG/ML W/ROPIVACAINE 0.2% IN NS 100 ML EPIDURAL INFUSION (ARMC-ANES)
EPIDURAL | Status: AC
  Filled 2015-10-11: qty 100

## 2015-10-11 MED ORDER — DIPHENHYDRAMINE HCL 25 MG PO CAPS: 25.0000 mg | ORAL_CAPSULE | Freq: Four times a day (QID) | ORAL | Status: DC | PRN

## 2015-10-11 MED ORDER — LACTATED RINGERS IV SOLN
INTRAVENOUS | Status: DC
  Administered 2015-10-11 (×2): via INTRAVENOUS

## 2015-10-11 MED ORDER — LIDOCAINE HCL (PF) 1 % IJ SOLN
30.0000 mL | INTRAMUSCULAR | Status: DC | PRN
  Filled 2015-10-11: qty 30

## 2015-10-11 MED ORDER — SOD CITRATE-CITRIC ACID 500-334 MG/5ML PO SOLN
30.0000 mL | ORAL | Status: DC | PRN
  Filled 2015-10-11: qty 30

## 2015-10-11 MED ORDER — FENTANYL 2.5 MCG/ML W/ROPIVACAINE 0.2% IN NS 100 ML EPIDURAL INFUSION (ARMC-ANES): 10.0000 mL/h | EPIDURAL | Status: DC

## 2015-10-11 MED ORDER — SODIUM CHLORIDE 0.9 % IV SOLN
INTRAVENOUS | Status: DC | PRN
  Administered 2015-10-11 (×2): 5 mL via EPIDURAL

## 2015-10-11 MED ORDER — PRENATAL MULTIVITAMIN CH
1.0000 | ORAL_TABLET | Freq: Every day | ORAL | Status: DC
  Administered 2015-10-12 – 2015-10-13 (×2): 1 via ORAL
  Filled 2015-10-11 (×2): qty 1

## 2015-10-11 MED ORDER — COCONUT OIL OIL: 1.0000 "application " | TOPICAL_OIL | Status: DC | PRN

## 2015-10-11 MED ORDER — SENNOSIDES-DOCUSATE SODIUM 8.6-50 MG PO TABS
2.0000 | ORAL_TABLET | ORAL | Status: DC
  Administered 2015-10-12 – 2015-10-13 (×2): 2 via ORAL
  Filled 2015-10-11 (×2): qty 2

## 2015-10-11 MED ORDER — BUTORPHANOL TARTRATE 1 MG/ML IJ SOLN
2.0000 mg | INTRAMUSCULAR | Status: DC | PRN
  Administered 2015-10-11: 2 mg via INTRAVENOUS

## 2015-10-11 MED ORDER — LIDOCAINE-EPINEPHRINE (PF) 1.5 %-1:200000 IJ SOLN
INTRAMUSCULAR | Status: DC | PRN
  Administered 2015-10-11: 3 mL via EPIDURAL

## 2015-10-11 MED ORDER — WITCH HAZEL-GLYCERIN EX PADS: 1.0000 "application " | MEDICATED_PAD | CUTANEOUS | Status: DC | PRN

## 2015-10-11 MED ORDER — BENZOCAINE-MENTHOL 20-0.5 % EX AERO
1.0000 "application " | INHALATION_SPRAY | CUTANEOUS | Status: DC | PRN
  Administered 2015-10-13: 1 via TOPICAL
  Filled 2015-10-11 (×2): qty 56

## 2015-10-11 MED ORDER — DIPHENHYDRAMINE HCL 50 MG/ML IJ SOLN: 12.5000 mg | INTRAMUSCULAR | Status: DC | PRN

## 2015-10-11 MED ORDER — MEPERIDINE HCL 25 MG/ML IJ SOLN: 6.2500 mg | INTRAMUSCULAR | Status: DC | PRN

## 2015-10-11 MED ORDER — FERROUS SULFATE 325 (65 FE) MG PO TABS
325.0000 mg | ORAL_TABLET | Freq: Two times a day (BID) | ORAL | Status: DC
  Administered 2015-10-12 – 2015-10-13 (×3): 325 mg via ORAL
  Filled 2015-10-11 (×3): qty 1

## 2015-10-11 MED ORDER — OXYTOCIN BOLUS FROM INFUSION
500.0000 mL | Freq: Once | INTRAVENOUS | Status: AC
  Administered 2015-10-11: 500 mL via INTRAVENOUS

## 2015-10-11 MED ORDER — LIDOCAINE HCL (PF) 1 % IJ SOLN
INTRAMUSCULAR | Status: DC | PRN
  Administered 2015-10-11: 4 mL

## 2015-10-11 MED ORDER — MAGNESIUM HYDROXIDE 400 MG/5ML PO SUSP: 30.0000 mL | ORAL | Status: DC | PRN

## 2015-10-11 MED ORDER — FENTANYL 2.5 MCG/ML W/ROPIVACAINE 0.2% IN NS 100 ML EPIDURAL INFUSION (ARMC-ANES)
EPIDURAL | Status: DC | PRN
  Administered 2015-10-11: 10 mL/h via EPIDURAL

## 2015-10-11 MED ORDER — MEASLES, MUMPS & RUBELLA VAC ~~LOC~~ INJ
0.5000 mL | INJECTION | Freq: Once | SUBCUTANEOUS | Status: DC
  Filled 2015-10-11: qty 0.5

## 2015-10-11 MED ORDER — OXYTOCIN 40 UNITS IN LACTATED RINGERS INFUSION - SIMPLE MED
2.5000 [IU]/h | INTRAVENOUS | Status: DC
  Filled 2015-10-11: qty 1000

## 2015-10-11 MED ORDER — IBUPROFEN 600 MG PO TABS
600.0000 mg | ORAL_TABLET | Freq: Four times a day (QID) | ORAL | Status: DC
  Administered 2015-10-11 – 2015-10-12 (×3): 600 mg via ORAL
  Filled 2015-10-11 (×3): qty 1

## 2015-10-11 MED ORDER — DIBUCAINE 1 % RE OINT
1.0000 | TOPICAL_OINTMENT | RECTAL | Status: DC | PRN
Start: 2015-10-11 — End: 2015-10-13

## 2015-10-11 MED ORDER — NALOXONE HCL 0.4 MG/ML IJ SOLN: 0.4000 mg | INTRAMUSCULAR | Status: DC | PRN

## 2015-10-11 MED ORDER — BENZOCAINE-MENTHOL 20-0.5 % EX AERO
INHALATION_SPRAY | CUTANEOUS | Status: AC
Start: 2015-10-11 — End: 2015-10-11
  Administered 2015-10-11: 19:00:00
  Filled 2015-10-11: qty 56

## 2015-10-11 MED ORDER — ZOLPIDEM TARTRATE 5 MG PO TABS: 5.0000 mg | ORAL_TABLET | Freq: Every evening | ORAL | Status: DC | PRN

## 2015-10-11 NOTE — Anesthesia Preprocedure Evaluation (Addendum)
Anesthesia Evaluation  Patient identified by MRN, date of birth, ID band Patient awake    Reviewed: Allergy & Precautions, H&P , NPO status , Patient's Chart, lab work & pertinent test results, reviewed documented beta blocker date and time   History of Anesthesia Complications Negative for: history of anesthetic complications  Airway Mallampati: II  TM Distance: >3 FB Neck ROM: full    Dental no notable dental hx. (+) Teeth Intact   Pulmonary neg pulmonary ROS, former smoker,           Cardiovascular Exercise Tolerance: Good negative cardio ROS       Neuro/Psych negative neurological ROS  negative psych ROS   GI/Hepatic negative GI ROS, Neg liver ROS,   Endo/Other  negative endocrine ROS  Renal/GU negative Renal ROS  negative genitourinary   Musculoskeletal   Abdominal   Peds  Hematology negative hematology ROS (+)   Anesthesia Other Findings Past Medical History: No date: Appendicitis No date: UTI (urinary tract infection)   Reproductive/Obstetrics (+) Pregnancy                             Anesthesia Physical Anesthesia Plan  ASA: II  Anesthesia Plan: Epidural   Post-op Pain Management:    Induction:   Airway Management Planned:   Additional Equipment:   Intra-op Plan:   Post-operative Plan:   Informed Consent: I have reviewed the patients History and Physical, chart, labs and discussed the procedure including the risks, benefits and alternatives for the proposed anesthesia with the patient or authorized representative who has indicated his/her understanding and acceptance.   Dental Advisory Given  Plan Discussed with: Anesthesiologist, CRNA and Surgeon  Anesthesia Plan Comments:         Anesthesia Quick Evaluation

## 2015-10-11 NOTE — H&P (Signed)
Nancy Koch is a 20 y.o. female presenting for SROM at 0330 thia am  EGA: 38+4 weeks . OB History    Gravida Para Term Preterm AB Living   1             SAB TAB Ectopic Multiple Live Births                 Past Medical History:  Diagnosis Date  . Appendicitis   . UTI (urinary tract infection)    Past Surgical History:  Procedure Laterality Date  . APPENDECTOMY    . LAPAROSCOPIC APPENDECTOMY N/A 08/13/2014   Procedure: APPENDECTOMY LAPAROSCOPIC;  Surgeon: Lattie Hawichard E Cooper, MD;  Location: [facility] ORS;  Service: General;  Laterality: N/A;  . LAPAROSCOPY N/A 11/10/2014   Procedure: LAPAROSCOPY DIAGNOSTIC;  Surgeon: Suzy Bouchardhomas J Schermerhorn, MD;  Location: [facility] ORS;  Service: Gynecology;  Laterality: N/A;   Family History: family history includes Cancer in her maternal grandmother and mother; Depression in her paternal grandmother; Diabetes in her father, paternal grandfather, and sister. Social History:  reports that she quit smoking about 7 months ago. Her smoking use included Cigarettes. She smoked 1.00 pack per day. She has never used smokeless tobacco. She reports that she does not drink alcohol or use drugs.     Maternal Diabetes: No Genetic Screening: Normal Maternal Ultrasounds/Referrals: Normal Fetal Ultrasounds or other Referrals:  None Maternal Substance Abuse:  No Significant Maternal Medications:  None Significant Maternal Lab Results:  None Other Comments:  None  ROS History Dilation: 2 Effacement (%): 90 Station: -1 Exam by:: Dr.Schermerhorn Blood pressure 119/64, pulse 65, temperature 97.5 F (36.4 C), temperature source Oral, resp. rate 16, height 5\' 6"  (1.676 m), weight 163 lb (73.9 kg), last menstrual period 01/13/2015. Exam  Lungs CTA CV RRR  cx as above  EFM: 130 , + accels , no decels , ctx q 3 min  Physical Exam  Prenatal labs: ABO, Rh: --/--/O NEG (10/27 1255) Antibody: NEG (10/27 1254) Rubella: Immune (03/08 0000) RPR:    HBsAg: Negative (03/08  0000)  HIV: Non-reactive (04/27 0000)  GBS: Negative (09/08 0000)   Assessment/Plan: Labor , SROM  Reassuring fetal monitoring    SCHERMERHORN,THOMAS 10/11/2015, 6:51 AM

## 2015-10-11 NOTE — Progress Notes (Signed)
Pt moved to LDR 5

## 2015-10-11 NOTE — Anesthesia Procedure Notes (Signed)
Epidural Patient location during procedure: OB Start time: 10/11/2015 10:07 AM End time: 10/11/2015 10:17 AM  Staffing Anesthesiologist: Lenard SimmerKARENZ, Mattias Walmsley Performed: anesthesiologist   Preanesthetic Checklist Completed: patient identified, site marked, surgical consent, pre-op evaluation, timeout performed, IV checked, risks and benefits discussed and monitors and equipment checked  Epidural Patient position: sitting Prep: ChloraPrep Patient monitoring: heart rate, continuous pulse ox and blood pressure Approach: midline Location: L4-L5 Injection technique: LOR saline  Needle:  Needle type: Tuohy  Needle gauge: 17 G Needle length: 9 cm and 9 Needle insertion depth: 5 cm Catheter type: closed end flexible Catheter size: 19 Gauge Catheter at skin depth: 9.5 cm Test dose: negative and 1.5% lidocaine with Epi 1:200 K  Assessment Sensory level: T10 Events: blood not aspirated, injection not painful, no injection resistance, negative IV test and no paresthesia  Additional Notes Pt. Evaluated and documentation done after procedure finished. Patient identified. Risks/Benefits/Options discussed with patient including but not limited to bleeding, infection, nerve damage, paralysis, failed block, incomplete pain control, headache, blood pressure changes, nausea, vomiting, reactions to medication both or allergic, itching and postpartum back pain. Confirmed with bedside nurse the patient's most recent platelet count. Confirmed with patient that they are not currently taking any anticoagulation, have any bleeding history or any family history of bleeding disorders. Patient expressed understanding and wished to proceed. All questions were answered. Sterile technique was used throughout the entire procedure. Please see nursing notes for vital signs. Test dose was given through epidural catheter and negative prior to continuing to dose epidural or start infusion. Warning signs of high block given to  the patient including shortness of breath, tingling/numbness in hands, complete motor block, or any concerning symptoms with instructions to call for help. Patient was given instructions on fall risk and not to get out of bed. All questions and concerns addressed with instructions to call with any issues or inadequate analgesia.   Patient tolerated the insertion well without immediate complications.Reason for block:procedure for pain

## 2015-10-12 ENCOUNTER — Encounter: Payer: Self-pay | Admitting: Anesthesiology

## 2015-10-12 LAB — CBC
HEMATOCRIT: 24.7 % — AB (ref 35.0–47.0)
Hemoglobin: 8 g/dL — ABNORMAL LOW (ref 12.0–16.0)
MCH: 26 pg (ref 26.0–34.0)
MCHC: 32.4 g/dL (ref 32.0–36.0)
MCV: 80.2 fL (ref 80.0–100.0)
Platelets: 151 10*3/uL (ref 150–440)
RBC: 3.08 MIL/uL — ABNORMAL LOW (ref 3.80–5.20)
RDW: 14.4 % (ref 11.5–14.5)
WBC: 12.9 10*3/uL — AB (ref 3.6–11.0)

## 2015-10-12 LAB — RPR: RPR: NONREACTIVE

## 2015-10-12 MED ORDER — IBUPROFEN 600 MG PO TABS
600.0000 mg | ORAL_TABLET | Freq: Four times a day (QID) | ORAL | Status: DC
  Administered 2015-10-12 – 2015-10-13 (×4): 600 mg via ORAL
  Filled 2015-10-12 (×4): qty 1

## 2015-10-12 MED ORDER — VITAMIN C 500 MG PO TABS
250.0000 mg | ORAL_TABLET | Freq: Two times a day (BID) | ORAL | Status: DC
  Administered 2015-10-13: 250 mg via ORAL
  Filled 2015-10-12: qty 1

## 2015-10-12 NOTE — Anesthesia Postprocedure Evaluation (Signed)
Anesthesia Post Note  Patient: Nancy Koch  Procedure(s) Performed: * No procedures listed *  Patient location during evaluation: Mother Baby Anesthesia Type: Epidural Level of consciousness: awake, awake and alert and oriented Pain management: pain level controlled Vital Signs Assessment: post-procedure vital signs reviewed and stable Respiratory status: spontaneous breathing, nonlabored ventilation and respiratory function stable Cardiovascular status: blood pressure returned to baseline Postop Assessment: no headache and no backache Anesthetic complications: no    Last Vitals:  Vitals:   10/12/15 0419 10/12/15 0740  BP: 115/67 108/61  Pulse: 66 70  Resp:  18  Temp: 36.6 C 36.6 C    Last Pain:  Vitals:   10/12/15 0740  TempSrc: Oral  PainSc:                  Lyn RecordsNoles,  Rayaan Lorah R

## 2015-10-12 NOTE — Progress Notes (Signed)
Post Partum Day 1 Subjective: no complaints  Objective: Blood pressure 123/71, pulse 81, temperature 97.9 F (36.6 C), temperature source Oral, resp. rate 18, height 5\' 6"  (1.676 m), weight 163 lb (73.9 kg), last menstrual period 01/13/2015, SpO2 99 %, unknown if currently breastfeeding.  Physical Exam:  General: alert and cooperative Lochia: appropriate Uterine Fundus: firm DVT Evaluation: No evidence of DVT seen on physical exam.   Recent Labs  10/11/15 0730 10/12/15 0517  HGB 10.1* 8.0*  HCT 29.7* 24.7*    Assessment/Plan: Plan for discharge tomorrow, Breastfeeding and Lactation consult  No rhogam needed Acute blood loss anemia- iron vit C   LOS: 1 day   Vina Byrd 10/12/2015, 8:41 PM

## 2015-10-12 NOTE — Anesthesia Postprocedure Evaluation (Signed)
Anesthesia Post Note  Patient: Nancy LameKelsie J Jennings  Procedure(s) Performed: * No procedures listed *  Anesthesia Type: Epidural Level of consciousness: awake and awake and alert Pain management: pain level not controlled Vital Signs Assessment: post-procedure vital signs reviewed and stable Respiratory status: spontaneous breathing and nonlabored ventilation Cardiovascular status: blood pressure returned to baseline and stable Postop Assessment: no headache and no backache Anesthetic complications: no    Last Vitals:  Vitals:   10/12/15 0419 10/12/15 0740  BP: 115/67 108/61  Pulse: 66 70  Resp:  18  Temp: 36.6 C 36.6 C    Last Pain:  Vitals:   10/12/15 0740  TempSrc: Oral  PainSc:                  Lyn RecordsNoles,  Elzia Hott R

## 2015-10-13 MED ORDER — MEDROXYPROGESTERONE ACETATE 150 MG/ML IM SUSP
150.0000 mg | Freq: Once | INTRAMUSCULAR | Status: AC
  Administered 2015-10-13: 150 mg via INTRAMUSCULAR
  Filled 2015-10-13: qty 1

## 2015-10-13 MED ORDER — IBUPROFEN 600 MG PO TABS: 600.0000 mg | ORAL_TABLET | Freq: Four times a day (QID) | ORAL | 0 refills | Status: DC

## 2015-10-13 MED ORDER — BENZOCAINE-MENTHOL 20-0.5 % EX AERO: 1.0000 "application " | INHALATION_SPRAY | CUTANEOUS | 1 refills | Status: DC | PRN

## 2015-10-13 NOTE — Progress Notes (Signed)
Patient discharged home with infant. Vital signs stable, bleeding within normal limits, uterus firm. Discharge instructions, prescriptions, and follow up appointment given to and reviewed with patient. Patient verbalized understanding, all questions answered. Escorted in wheelchair by auxiliary.    Wilson Sample, RN  

## 2015-10-13 NOTE — Discharge Summary (Signed)
Obstetric Discharge Summary Reason for Admission: onset of labor Prenatal Procedures: none Intrapartum Procedures: spontaneous vaginal delivery Postpartum Procedures: none Complications-Operative and Postpartum: none MLE + repair Hemoglobin  Date Value Ref Range Status  10/12/2015 8.0 (L) 12.0 - 16.0 g/dL Corrected    Comment:    CORRECTED ON 10/02 AT [id number]: PREVIOUSLY REPORTED AS 8.1   HCT  Date Value Ref Range Status  10/12/2015 24.7 (L) 35.0 - 47.0 % Corrected    Comment:    CORRECTED ON 10/02 AT [id number]: PREVIOUSLY REPORTED AS 24.4    Physical Exam:  General: alert and cooperative Lochia: appropriate Uterine Fundus: firm Incision: healing well DVT Evaluation: No evidence of DVT seen on physical exam.  Discharge Diagnoses: Term Pregnancy-delivered  Discharge Information: Date: 10/13/2015 Activity: pelvic rest Diet: routine Medications: Ibuprofen and depoprovera Condition: stable Instructions: refer to practice specific booklet Discharge to: home Follow-up Information    Nancy Benjamin, MD Follow up in 6 week(s).   Specialty:  Obstetrics and Gynecology Why:  PP care Contact information: [address] [facility] [place] [phone]           Newborn Data: Live born female  Birth Weight: 8 lb 4.6 oz (3760 g) APGAR: 8, 9  Home with mother.  Nancy Koch 10/13/2015, 9:57 AM

## 2015-10-13 NOTE — Discharge Instructions (Signed)
Vaginal Delivery, Care After °Refer to this sheet in the next few weeks. These discharge instructions provide you with information on caring for yourself after delivery. Your health care provider may also give you specific instructions. Your treatment has been planned according to the most current medical practices available, but problems sometimes occur. Call your health care provider if you have any problems or questions after you go home. °HOME CARE INSTRUCTIONS °· Take over-the-counter or prescription medicines only as directed by your health care provider or pharmacist. °· Do not drink alcohol, especially if you are breastfeeding or taking medicine to relieve pain. °· Do not chew or smoke tobacco. °· Do not use illegal drugs. °· Continue to use good perineal care. Good perineal care includes: °¨ Wiping your perineum from front to back. °¨ Keeping your perineum clean. °· Do not use tampons or douche for the next 6 weeks.  °· Showers only, no tub baths for the next 6 weeks. °· Wear a well-fitting bra that provides breast support. °· Eat healthy foods. °· Drink enough fluids to keep your urine clear or pale yellow. °· Eat high-fiber foods such as whole grain cereals and breads, brown rice, beans, and fresh fruits and vegetables every day. These foods may help prevent or relieve constipation. °· Follow your health care provider's recommendations regarding resumption of activities such as climbing stairs, driving, lifting, exercising, or traveling. °· No sexual intercourse for at least the next 6 weeks, and then not until you feel ready.  °· Try to have someone help you with your household activities and your newborn for at least a few days after you leave the hospital. °· Rest as much as possible. Try to rest or take a nap when your newborn is sleeping. °· Increase your activities gradually. °· Keep all of your scheduled postpartum appointments. It is very important to keep your scheduled follow-up appointments. At  these appointments, your health care provider will be checking to make sure that you are healing physically and emotionally. °SEEK MEDICAL CARE IF:  °· You are passing large clots from your vagina. Save any clots to show your health care provider. °· You have a foul smelling discharge from your vagina. °· You have trouble urinating. °· You are urinating frequently. °· You have pain when you urinate. °· You have a change in your bowel movements. °· You have increasing redness, pain, or swelling near your vaginal incision (episiotomy) or vaginal tear. °· You have pus draining from your episiotomy or vaginal tear. °· Your episiotomy or vaginal tear is separating. °· You have painful, hard, or reddened breasts. °· You have a severe headache. °· You have blurred vision or see spots. °· You feel sad or depressed. °· You have thoughts of hurting yourself or your newborn. °· You have questions about your care, the care of your newborn, or medicines. °· You are dizzy or light-headed. °· You have a rash. °· You have nausea or vomiting. °· You were breastfeeding and have not had a menstrual period within 12 weeks after you stopped breastfeeding. °· You are not breastfeeding and have not had a menstrual period by the 12th week after delivery. °· You have a fever. °SEEK IMMEDIATE MEDICAL CARE IF:  °· You have persistent pain. °· You have chest pain. °· You have shortness of breath. °· You faint. °· You have leg pain. °· You have stomach pain. °· Your vaginal bleeding saturates two or more sanitary pads in 1 hour. °  °This   information is not intended to replace advice given to you by your health care provider. Make sure you discuss any questions you have with your health care provider. °  °Document Released: 12/25/1999 Document Revised: 09/17/2014 Document Reviewed: 08/24/2011 °Elsevier Interactive Patient Education ©2016 Elsevier Inc. ° °

## 2015-10-22 ENCOUNTER — Inpatient Hospital Stay
Admission: AD | Admit: 2015-10-22 | Discharge: 2015-10-25 | DRG: 776 | Disposition: A | Discharge status: Home / Self Care | Payer: Medicaid Other | Source: Ambulatory Visit | Attending: Obstetrics and Gynecology | Admitting: Obstetrics and Gynecology

## 2015-10-22 DIAGNOSIS — O8612 Endometritis following delivery: Principal | ICD-10-CM | POA: Diagnosis present

## 2015-10-22 DIAGNOSIS — O864 Pyrexia of unknown origin following delivery: Secondary | ICD-10-CM | POA: Diagnosis present

## 2015-10-22 DIAGNOSIS — Z87891 Personal history of nicotine dependence: Secondary | ICD-10-CM

## 2015-10-22 LAB — COMPREHENSIVE METABOLIC PANEL
ALK PHOS: 141 U/L — AB (ref 38–126)
ALT: 21 U/L (ref 14–54)
ANION GAP: 9 (ref 5–15)
AST: 21 U/L (ref 15–41)
Albumin: 3.9 g/dL (ref 3.5–5.0)
BUN: 10 mg/dL (ref 6–20)
CALCIUM: 9.2 mg/dL (ref 8.9–10.3)
CHLORIDE: 105 mmol/L (ref 101–111)
CO2: 21 mmol/L — AB (ref 22–32)
Creatinine, Ser: 0.79 mg/dL (ref 0.44–1.00)
GFR calc non Af Amer: >60 mL/min (ref >60)
Glucose, Bld: 83 mg/dL (ref 65–99)
Potassium: 3.5 mmol/L (ref 3.5–5.1)
SODIUM: 135 mmol/L (ref 135–145)
Total Bilirubin: 0.5 mg/dL (ref 0.3–1.2)
Total Protein: 7.5 g/dL (ref 6.5–8.1)

## 2015-10-22 LAB — INFLUENZA PANEL BY PCR (TYPE A & B)
H1N1FLUPCR: NOT DETECTED
INFLBPCR: NEGATIVE
Influenza A By PCR: NEGATIVE

## 2015-10-22 LAB — URINALYSIS COMPLETE WITH MICROSCOPIC (ARMC ONLY)
BILIRUBIN URINE: NEGATIVE
Bacteria, UA: NONE SEEN
GLUCOSE, UA: NEGATIVE mg/dL
Ketones, ur: NEGATIVE mg/dL
Nitrite: NEGATIVE
Protein, ur: 100 mg/dL — AB
Specific Gravity, Urine: 1.025 (ref 1.005–1.030)
pH: 6 (ref 5.0–8.0)

## 2015-10-22 LAB — CBC
HCT: 34.1 % — ABNORMAL LOW (ref 35.0–47.0)
Hemoglobin: 11.8 g/dL — ABNORMAL LOW (ref 12.0–16.0)
MCH: 28.1 pg (ref 26.0–34.0)
MCHC: 34.5 g/dL (ref 32.0–36.0)
MCV: 81.3 fL (ref 80.0–100.0)
PLATELETS: 230 10*3/uL (ref 150–440)
RBC: 4.19 MIL/uL (ref 3.80–5.20)
RDW: 15 % — AB (ref 11.5–14.5)
WBC: 10.9 10*3/uL (ref 3.6–11.0)

## 2015-10-22 MED ORDER — ACETAMINOPHEN 500 MG PO TABS
1000.0000 mg | ORAL_TABLET | Freq: Four times a day (QID) | ORAL | Status: DC | PRN
  Administered 2015-10-22 – 2015-10-23 (×4): 1000 mg via ORAL
  Filled 2015-10-22 (×4): qty 2

## 2015-10-22 MED ORDER — CLINDAMYCIN PHOSPHATE 900 MG/50ML IV SOLN
900.0000 mg | Freq: Three times a day (TID) | INTRAVENOUS | Status: DC
  Administered 2015-10-22 – 2015-10-23 (×4): 900 mg via INTRAVENOUS
  Filled 2015-10-22 (×8): qty 50

## 2015-10-22 MED ORDER — LACTATED RINGERS IV SOLN
INTRAVENOUS | Status: DC
Start: 2015-10-22 — End: 2015-10-25
  Administered 2015-10-22 – 2015-10-25 (×7): via INTRAVENOUS

## 2015-10-22 MED ORDER — DICLOXACILLIN SODIUM 500 MG PO CAPS
500.0000 mg | ORAL_CAPSULE | Freq: Four times a day (QID) | ORAL | Status: DC
  Filled 2015-10-22: qty 1

## 2015-10-22 MED ORDER — GENTAMICIN SULFATE 40 MG/ML IJ SOLN
5.0000 mg/kg | INTRAVENOUS | Status: DC
  Administered 2015-10-22 – 2015-10-23 (×2): 370 mg via INTRAVENOUS
  Filled 2015-10-22 (×3): qty 9.25

## 2015-10-22 MED ORDER — HYDROCODONE-ACETAMINOPHEN 5-325 MG PO TABS: 1.0000 | ORAL_TABLET | Freq: Four times a day (QID) | ORAL | Status: DC | PRN

## 2015-10-22 MED ORDER — PRENATAL MULTIVITAMIN CH
1.0000 | ORAL_TABLET | Freq: Every day | ORAL | Status: DC
  Administered 2015-10-23 – 2015-10-25 (×3): 1 via ORAL
  Filled 2015-10-22 (×3): qty 1

## 2015-10-22 NOTE — Progress Notes (Signed)
Patient ID: Nancy Koch, female   DOB: [birth year], 20 y.o.   MRN: [id number]  Pt c/o breast lump and soreness. On exam, breast lump resolved after let down and pumping. Exam normal and no evidence of mastitis at this time.  Flu swabs returned negative.

## 2015-10-22 NOTE — H&P (Signed)
Nancy Koch is an 20 y.o. female.G1P1 s/p uncomplicated SVD 10 days ago  . 1 day of fever 103.2 . Breast feeding  No redness. Myalgia . Sore throat   Pertinent Gynecological History: Menstrual History: Patient's last menstrual period was 01/13/2015.  PMHX : pelvic pain  PSHx : L/s 2016, appy   Past Medical History:  Diagnosis Date  . Appendicitis   . UTI (urinary tract infection)     Past Surgical History:  Procedure Laterality Date  . APPENDECTOMY    . LAPAROSCOPIC APPENDECTOMY N/A 08/13/2014   Procedure: APPENDECTOMY LAPAROSCOPIC;  Surgeon: Lattie Hawichard E Cooper, MD;  Location: [facility] ORS;  Service: General;  Laterality: N/A;  . LAPAROSCOPY N/A 11/10/2014   Procedure: LAPAROSCOPY DIAGNOSTIC;  Surgeon: Suzy Bouchardhomas J Schermerhorn, MD;  Location: [facility] ORS;  Service: Gynecology;  Laterality: N/A;    Family History  Problem Relation Age of Onset  . Cancer Mother     cervical  . Diabetes Father   . Diabetes Sister   . Cancer Maternal Grandmother   . Depression Paternal Grandmother   . Diabetes Paternal Grandfather     Social History:  reports that she quit smoking about 8 months ago. Her smoking use included Cigarettes. She smoked 1.00 pack per day. She has never used smokeless tobacco. She reports that she does not drink alcohol or use drugs.  Allergies: No Known Allergies  Prescriptions Prior to Admission  Medication Sig Dispense Refill Last Dose  . benzocaine-Menthol (DERMOPLAST) 20-0.5 % AERO Apply 1 application topically as needed for irritation (perineal discomfort). 1 each 1   . ibuprofen (ADVIL,MOTRIN) 600 MG tablet Take 1 tablet (600 mg total) by mouth every 6 (six) hours. 30 tablet 0   . Prenatal Vit-Fe Fumarate-FA (MULTIVITAMIN-PRENATAL) 27-0.8 MG TABS tablet Take 1 tablet by mouth daily at 12 noon.   Past Week at Unknown time    ROS  Blood pressure 118/71, pulse (!) 115, temperature 98.4 F (36.9 C), temperature source Oral, resp. rate 20, last menstrual period  01/13/2015, SpO2 100 %, unknown if currently breastfeeding. Physical Exam  Lungs CTA  CV RRR  Breast : engorged , no erythema  adb : soft + uterine TTP  Pelvic , episiotomy intact cx no mucopus . Utx : + TTP   No results found for this or any previous visit (from the past 24 hour(s)).  No results found.  Assessment/Plan: Postpartum endometritis  Admit Start Gentamycin and Cleocin  Blood cx UA  CBc CMP GC/Chlamydia on UA  SCHERMERHORN,THOMAS 10/22/2015, 5:15 PM

## 2015-10-23 LAB — CHLAMYDIA/NGC RT PCR (ARMC ONLY)
Chlamydia Tr: NOT DETECTED
N gonorrhoeae: NOT DETECTED

## 2015-10-23 LAB — CBC
HCT: 32 % — ABNORMAL LOW (ref 35.0–47.0)
Hemoglobin: 10.7 g/dL — ABNORMAL LOW (ref 12.0–16.0)
MCH: 26.8 pg (ref 26.0–34.0)
MCHC: 33.3 g/dL (ref 32.0–36.0)
MCV: 80.3 fL (ref 80.0–100.0)
Platelets: 191 10*3/uL (ref 150–440)
RBC: 3.99 MIL/uL (ref 3.80–5.20)
RDW: 15.1 % — ABNORMAL HIGH (ref 11.5–14.5)
WBC: 12.7 10*3/uL — ABNORMAL HIGH (ref 3.6–11.0)

## 2015-10-23 LAB — LACTIC ACID, PLASMA: Lactic Acid, Venous: 1.1 mmol/L (ref 0.5–1.9)

## 2015-10-23 MED ORDER — CLINDAMYCIN PHOSPHATE 900 MG/50ML IV SOLN
900.0000 mg | Freq: Three times a day (TID) | INTRAVENOUS | Status: DC
  Administered 2015-10-23 – 2015-10-24 (×3): 900 mg via INTRAVENOUS
  Filled 2015-10-23 (×5): qty 50

## 2015-10-24 LAB — CBC WITH DIFFERENTIAL/PLATELET
Basophils Absolute: 0 10*3/uL (ref 0–0.1)
Basophils Relative: 1 %
EOS PCT: 6 %
Eosinophils Absolute: 0.4 10*3/uL (ref 0–0.7)
HEMATOCRIT: 28.6 % — AB (ref 35.0–47.0)
Hemoglobin: 9.5 g/dL — ABNORMAL LOW (ref 12.0–16.0)
LYMPHS ABS: 1 10*3/uL (ref 1.0–3.6)
LYMPHS PCT: 17 %
MCH: 26.6 pg (ref 26.0–34.0)
MCHC: 33.4 g/dL (ref 32.0–36.0)
MCV: 79.8 fL — AB (ref 80.0–100.0)
MONO ABS: 0.4 10*3/uL (ref 0.2–0.9)
MONOS PCT: 7 %
NEUTROS ABS: 4 10*3/uL (ref 1.4–6.5)
Neutrophils Relative %: 69 %
PLATELETS: 173 10*3/uL (ref 150–440)
RBC: 3.58 MIL/uL — ABNORMAL LOW (ref 3.80–5.20)
RDW: 15 % — AB (ref 11.5–14.5)
WBC: 5.8 10*3/uL (ref 3.6–11.0)

## 2015-10-24 NOTE — Progress Notes (Signed)
Obstetric and Gynecology  HD # 3  Subjective  Patient doing well, had fewer and lower fever spikes.  Still very tender in uterus, especially with movement.    Denies CP, SOB, N/V/D, or leg pain.   Objective  Objective:  BP 113/70 (BP Location: Right Arm)   Pulse 74   Temp 97.9 F (36.6 C) (Oral)   Resp 18   Ht  (1.676 m)   Wt 63.5 kg (140 lb)   LMP 01/13/2015   SpO2 100%   BMI 22.60 kg/m   Temp:  [97.5 F (36.4 C)-101.1 F (38.4 C)] 97.9 F (36.6 C) (10/14 0744) Pulse Rate:  [67-98] 74 (10/14 0744) Resp:  [18-20] 18 (10/14 0744) BP: (105-134)/(64-76) 113/70 (10/14 0744) SpO2:  [100 %] 100 % (10/14 0744)  General: NAD Cardiovascular: RRR, no murmurs Pulmonary: CTAB, normal respiratory effort Abdomen: diffusely Non-tender in upper abdomen, +BS, +tenderness and guarding with deep palpation in pelvis only.  Extremities: No erythema or cords, no calf tenderness, with normal peripheral pulses.  Labs: Results for orders placed or performed during the hospital encounter of 10/22/15 (from the past 24 hour(s))  Lactic acid, plasma     Status: None   Collection Time: 10/23/15 12:47 PM  Result Value Ref Range   Lactic Acid, Venous 1.1 0.5 - 1.9 mmol/L  Culture, blood (routine x 2)     Status: None (Preliminary result)   Collection Time: 10/23/15 12:53 PM  Result Value Ref Range   Specimen Description BLOOD RIGHT ANTECUBITAL    Special Requests BOTTLES DRAWN AEROBIC AND ANAEROBIC     Culture NO GROWTH < 24 HOURS    Report Status PENDING   Culture, blood (routine x 2)     Status: None (Preliminary result)   Collection Time: 10/23/15  1:05 PM  Result Value Ref Range   Specimen Description BLOOD LEFT ANTECUBITAL    Special Requests BOTTLES DRAWN AEROBIC AND ANAEROBIC     Culture NO GROWTH < 24 HOURS    Report Status PENDING     Assessment   20 y.o. G1P1001 Hospital Day: 2 with post partum endometritis.   Plan   1. Febrile illness:  SIRS criteria with  tachycardia and fever.  Blood cultures drawn after admin of abx, so likely not helpful unless positive growth.  Improving with Clindamycin/Gentamicin.  Last fever spike was 100.5 @ 1900 10/13, downward trend with the fever curve.    IF she continues to spike fevers and the curve does not improve will add ampicillin AND consider workup for septic thrombophlebitis.  Will closely monitor fever curve.      Stop Tylenol administration and see what her fevers do today.      Anticipate d/c abx today as well if no fevers  2. Post-partum:  Pumping, please continue to facilitate any needs she has for this purpose.  Minimal Lochia.   3. Continue regular diet 4. Inpatient admission until 24hrs afebrile OFF antibiotics.  ----- Ranae Plumber, MD Attending Obstetrician and Gynecologist [facility], Department of OB/GYN [facility]

## 2015-10-24 NOTE — Progress Notes (Signed)
Per MD, Give one more dose of clindamycin at 1500. D/C 1630 dose of gentamicin.

## 2015-10-24 NOTE — Progress Notes (Signed)
Obstetric and Gynecology  HD # 2  Subjective  Patient doing well, still having fever spikes.  Still very tender in uterus, especially with movement.    Denies CP, SOB, N/V/D, or leg pain.   Objective  Objective:  Tc 98.7 Tm: 102.8 (1800)  Pc 93 Pm: 115 BP: 106/54 (stable) RR: 18-20 SpO2: 97-99% RA  General: NAD Cardiovascular: RRR, no murmurs Pulmonary: CTAB, normal respiratory effort Abdomen: diffusely Non-tender in upper abdomen, +BS, +tenderness and guarding with deep palpation in pelvis only.  Extremities: No erythema or cords, no calf tenderness, with normal peripheral pulses.  Labs: Cultures: Results for orders placed or performed during the hospital encounter of 10/22/15  Chlamydia/NGC rt PCR ([facility] only)     Status: None   Collection Time: 10/22/15 11:05 PM  Result Value Ref Range Status   Specimen source GC/Chlam URINE, RANDOM  Final   Chlamydia Tr NOT DETECTED NOT DETECTED Final   N gonorrhoeae NOT DETECTED NOT DETECTED Final    Comment: (NOTE) 100  This methodology has not been evaluated in pregnant women or in 200  patients with a history of hysterectomy. 300 400  This methodology will not be performed on patients less than 1214  years of age.    Imaging: No results found.   Assessment   20 y.o. G1P1001 Hospital Day: 2 with post partum endometritis.   Plan   1. Febrile illness:  SIRS criteria with tachycardia and fever.  Blood cultures were not drawn yesterday, will get them drawn today.  Improving with Clindamycin/Gentamicin.  Clinda was given essentially q6h instead of q8h - discussed with pharmacy and likely will not be toxic given her age and kidney function, however will insist that be appropriately administered with nursing.      IF she continues to spike fevers and the curve does not improve will add ampicillin AND consider workup for septic thrombophlebitis.  Will closely monitor fever curve.  Continue tylenol PRN 2. Post-partum:  Pumping, please  continue to facilitate any needs she has for this purpose.  Minimal Lochia.   3. Continue regular diet 4. Inpatient admission until 24hrs afebrile OFF antibiotics.  ----- Ranae Plumberhelsea Chalene Treu, MD Attending Obstetrician and Gynecologist [facility], Department of OB/GYN [facility]

## 2015-10-25 DIAGNOSIS — R102 Pelvic and perineal pain: Secondary | ICD-10-CM | POA: Insufficient documentation

## 2015-10-25 MED ORDER — BREAST MILK
ORAL | Status: DC
  Filled 2015-10-25 (×20): qty 1

## 2015-10-25 NOTE — Discharge Instructions (Signed)
Please call the office with any fevers over 100.3, with increasing pain or increasing bleeding, or any other concerns.  Nothing in vagina for 4 weeks.  Continue nursing as desired.

## 2015-10-25 NOTE — Discharge Summary (Signed)
Physician Discharge Summary  Patient ID: Nancy Koch MRN: [id number] DOB/AGE: Oct 04, 1995 20 y.o.  Admit date: 10/22/2015 Discharge date: 10/25/2015  Admission Diagnoses:  Discharge Diagnoses:  Active Problems:   Postpartum endometritis   Discharged Condition: good  Hospital Course: Pt admitted 10 days after uncomplicated NSVD with fever to 103 at home and pelvic pain. She was dx with postpartum endometritis and treated with gent/clinda until 24hrs after clinical improvement. Her blood cultures are no growth x 2 days by day of discharge. WBC returned to normal. No other s/s infection or spiking fevers. Because no bacteremia, no outpatient abx started.   F/U in1 week with Dr. Chauncey Cruel in the office.  Pt to call with fever, increasing pain, bleeding. Maintain pelvic rest. Normal diet.  Consults: None  Significant Diagnostic Studies:  Recent Results (from the past 2160 hour(s))  OB RESULT CONSOLE Group B Strep     Status: None   Collection Time: 09/18/15 12:00 AM  Result Value Ref Range   GBS Negative   Type and screen [place]     Status: None   Collection Time: 10/11/15  6:57 AM  Result Value Ref Range   ABO/RH(D) O NEG    Antibody Screen NEG    Sample Expiration 10/14/2015   CBC     Status: Abnormal   Collection Time: 10/11/15  7:30 AM  Result Value Ref Range   WBC 11.2 (H) 3.6 - 11.0 K/uL   RBC 3.75 (L) 3.80 - 5.20 MIL/uL   Hemoglobin 10.1 (L) 12.0 - 16.0 g/dL   HCT 29.7 (L) 35.0 - 47.0 %   MCV 79.3 (L) 80.0 - 100.0 fL   MCH 26.8 26.0 - 34.0 pg   MCHC 33.9 32.0 - 36.0 g/dL   RDW 14.0 11.5 - 14.5 %   Platelets 165 150 - 440 K/uL  RPR     Status: None   Collection Time: 10/11/15  7:30 AM  Result Value Ref Range   RPR Ser Ql Non Reactive Non Reactive    Comment: (NOTE) Performed At: [facility] [place], [place] [id number] Lindon Romp MD QA:[id number]   CBC     Status: Abnormal   Collection Time: 10/12/15  5:17 AM   Result Value Ref Range   WBC 12.9 (H) 3.6 - 11.0 K/uL    Comment: CORRECTED ON 10/02 AT 1021: PREVIOUSLY REPORTED AS 12.8   RBC 3.08 (L) 3.80 - 5.20 MIL/uL    Comment: CORRECTED ON 10/02 AT 1173: PREVIOUSLY REPORTED AS 3.06   Hemoglobin 8.0 (L) 12.0 - 16.0 g/dL    Comment: CORRECTED ON 10/02 AT 5670: PREVIOUSLY REPORTED AS 8.1   HCT 24.7 (L) 35.0 - 47.0 %    Comment: CORRECTED ON 10/02 AT 1410: PREVIOUSLY REPORTED AS 24.4   MCV 80.2 80.0 - 100.0 fL    Comment: CORRECTED ON 10/02 AT 0614: PREVIOUSLY REPORTED AS 79.8   MCH 26.0 26.0 - 34.0 pg    Comment: CORRECTED ON 10/02 AT 3013: PREVIOUSLY REPORTED AS 26.6   MCHC 32.4 32.0 - 36.0 g/dL    Comment: CORRECTED ON 10/02 AT 1438: PREVIOUSLY REPORTED AS 33.3   RDW 14.4 11.5 - 14.5 %    Comment: CORRECTED ON 10/02 AT 0614: PREVIOUSLY REPORTED AS 14.2   Platelets 151 150 - 440 K/uL    Comment: CORRECTED ON 10/02 AT 0614: PREVIOUSLY REPORTED AS 144  CBC     Status: Abnormal   Collection Time: 10/22/15  5:11  PM  Result Value Ref Range   WBC 10.9 3.6 - 11.0 K/uL   RBC 4.19 3.80 - 5.20 MIL/uL   Hemoglobin 11.8 (L) 12.0 - 16.0 g/dL   HCT 34.1 (L) 35.0 - 47.0 %   MCV 81.3 80.0 - 100.0 fL   MCH 28.1 26.0 - 34.0 pg   MCHC 34.5 32.0 - 36.0 g/dL   RDW 15.0 (H) 11.5 - 14.5 %   Platelets 230 150 - 440 K/uL  Comprehensive metabolic panel     Status: Abnormal   Collection Time: 10/22/15  5:11 PM  Result Value Ref Range   Sodium 135 135 - 145 mmol/L   Potassium 3.5 3.5 - 5.1 mmol/L   Chloride 105 101 - 111 mmol/L   CO2 21 (L) 22 - 32 mmol/L   Glucose, Bld 83 65 - 99 mg/dL   BUN 10 6 - 20 mg/dL   Creatinine, Ser 0.79 0.44 - 1.00 mg/dL   Calcium 9.2 8.9 - 10.3 mg/dL   Total Protein 7.5 6.5 - 8.1 g/dL   Albumin 3.9 3.5 - 5.0 g/dL   AST 21 15 - 41 U/L   ALT 21 14 - 54 U/L   Alkaline Phosphatase 141 (H) 38 - 126 U/L   Total Bilirubin 0.5 0.3 - 1.2 mg/dL   GFR calc non Af Amer >60 >60 mL/min   GFR calc Af Amer >60 >60 mL/min    Comment:  (NOTE) The eGFR has been calculated using the CKD EPI equation. This calculation has not been validated in all clinical situations. eGFR's persistently <60 mL/min signify possible Chronic Kidney Disease.    Anion gap 9 5 - 15  Influenza panel by PCR (type A & B, H1N1)     Status: None   Collection Time: 10/22/15  5:15 PM  Result Value Ref Range   Influenza A By PCR NEGATIVE NEGATIVE   Influenza B By PCR NEGATIVE NEGATIVE   H1N1 flu by pcr NOT DETECTED NOT DETECTED    Comment:        The Xpert Flu assay (FDA approved for nasal aspirates or washes and nasopharyngeal swab specimens), is intended as an aid in the diagnosis of influenza and should not be used as a sole basis for treatment.   Urinalysis complete, with microscopic ([facility] only)     Status: Abnormal   Collection Time: 10/22/15 11:05 PM  Result Value Ref Range   Color, Urine YELLOW (A) YELLOW   APPearance CLEAR (A) CLEAR   Glucose, UA NEGATIVE NEGATIVE mg/dL   Bilirubin Urine NEGATIVE NEGATIVE   Ketones, ur NEGATIVE NEGATIVE mg/dL   Specific Gravity, Urine 1.025 1.005 - 1.030   Hgb urine dipstick 3+ (A) NEGATIVE   pH 6.0 5.0 - 8.0   Protein, ur 100 (A) NEGATIVE mg/dL   Nitrite NEGATIVE NEGATIVE   Leukocytes, UA 1+ (A) NEGATIVE   RBC / HPF TOO NUMEROUS TO COUNT 0 - 5 RBC/hpf   WBC, UA 6-30 0 - 5 WBC/hpf   Bacteria, UA NONE SEEN NONE SEEN   Squamous Epithelial / LPF 0-5 (A) NONE SEEN   Mucous PRESENT   Chlamydia/NGC rt PCR ([facility] only)     Status: None   Collection Time: 10/22/15 11:05 PM  Result Value Ref Range   Specimen source GC/Chlam URINE, RANDOM    Chlamydia Tr NOT DETECTED NOT DETECTED   N gonorrhoeae NOT DETECTED NOT DETECTED    Comment: (NOTE) 100  This methodology has not been evaluated in pregnant women  or in 200  patients with a history of hysterectomy. 300 400  This methodology will not be performed on patients less than 21  years of age.   CBC     Status: Abnormal   Collection Time: 10/23/15   5:36 AM  Result Value Ref Range   WBC 12.7 (H) 3.6 - 11.0 K/uL   RBC 3.99 3.80 - 5.20 MIL/uL   Hemoglobin 10.7 (L) 12.0 - 16.0 g/dL   HCT 32.0 (L) 35.0 - 47.0 %   MCV 80.3 80.0 - 100.0 fL   MCH 26.8 26.0 - 34.0 pg   MCHC 33.3 32.0 - 36.0 g/dL   RDW 15.1 (H) 11.5 - 14.5 %   Platelets 191 150 - 440 K/uL  Lactic acid, plasma     Status: None   Collection Time: 10/23/15 12:47 PM  Result Value Ref Range   Lactic Acid, Venous 1.1 0.5 - 1.9 mmol/L  Culture, blood (routine x 2)     Status: None (Preliminary result)   Collection Time: 10/23/15 12:53 PM  Result Value Ref Range   Specimen Description BLOOD RIGHT ANTECUBITAL    Special Requests BOTTLES DRAWN AEROBIC AND ANAEROBIC  7ML    Culture NO GROWTH 2 DAYS    Report Status PENDING   Culture, blood (routine x 2)     Status: None (Preliminary result)   Collection Time: 10/23/15  1:05 PM  Result Value Ref Range   Specimen Description BLOOD LEFT ANTECUBITAL    Special Requests BOTTLES DRAWN AEROBIC AND ANAEROBIC  7ML    Culture NO GROWTH 2 DAYS    Report Status PENDING   CBC with Differential/Platelet     Status: Abnormal   Collection Time: 10/24/15 10:39 AM  Result Value Ref Range   WBC 5.8 3.6 - 11.0 K/uL   RBC 3.58 (L) 3.80 - 5.20 MIL/uL   Hemoglobin 9.5 (L) 12.0 - 16.0 g/dL   HCT 28.6 (L) 35.0 - 47.0 %   MCV 79.8 (L) 80.0 - 100.0 fL   MCH 26.6 26.0 - 34.0 pg   MCHC 33.4 32.0 - 36.0 g/dL   RDW 15.0 (H) 11.5 - 14.5 %   Platelets 173 150 - 440 K/uL   Neutrophils Relative % 69 %   Neutro Abs 4.0 1.4 - 6.5 K/uL   Lymphocytes Relative 17 %   Lymphs Abs 1.0 1.0 - 3.6 K/uL   Monocytes Relative 7 %   Monocytes Absolute 0.4 0.2 - 0.9 K/uL   Eosinophils Relative 6 %   Eosinophils Absolute 0.4 0 - 0.7 K/uL   Basophils Relative 1 %   Basophils Absolute 0.0 0 - 0.1 K/uL     Treatments: IV hydration and antibiotics: gentamycin and clinda  Discharge Exam: Blood pressure 120/66, pulse (!) 59, temperature 98.3 F (36.8 C),  temperature source Oral, resp. rate 18, height '5\' 6"'  (1.676 m), weight 140 lb (63.5 kg), last menstrual period 01/13/2015, SpO2 100 %, unknown if currently breastfeeding. General appearance: alert, cooperative, appears stated age and no distress Head: Normocephalic, without obvious abnormality, atraumatic Resp: clear to auscultation bilaterally Breasts: normal appearance, no masses or tenderness Cardio: regular rate and rhythm, S1, S2 normal, no murmur, click, rub or gallop GI: soft, non-tender; bowel sounds normal; no masses,  no organomegaly Pelvic: cervix normal in appearance, external genitalia normal, no adnexal masses or tenderness, no cervical motion tenderness, rectovaginal septum normal, uterus normal size, shape, and consistency and vagina normal without discharge Extremities: extremities normal, atraumatic, no cyanosis or  edema Pulses: 2+ and symmetric  Disposition: 01-Home or Self Care     Medication List    TAKE these medications   benzocaine-Menthol 20-0.5 % Aero Commonly known as:  DERMOPLAST Apply 1 application topically as needed for irritation (perineal discomfort).   ibuprofen 600 MG tablet Commonly known as:  ADVIL,MOTRIN Take 1 tablet (600 mg total) by mouth every 6 (six) hours.   multivitamin-prenatal 27-0.8 MG Tabs tablet Take 1 tablet by mouth daily at 12 noon.        SignedBenjaman Kindler 10/25/2015, 12:08 PM

## 2015-10-25 NOTE — Progress Notes (Signed)
Discharge instructions reviewed with patient and significant other with patient's permission. Patient verbalized understanding. No prescriptions given to patient as none were prescribed. 2 bottles of breast milk were returned to patient. Patient reminded to take all belongings and valuables included clothing and electronics. Vital signs stable. IV discontinued catheter intact, no signs or symptoms of infection or active bleeding noted. Dry dressing applied. Patient ambulatory, preferred to walk out the unit. Patient left the unit with significant other and belongings. Escorted to lobby by Nurse.

## 2015-10-28 LAB — CULTURE, BLOOD (ROUTINE X 2)
CULTURE: NO GROWTH
Culture: NO GROWTH

## 2016-03-02 ENCOUNTER — Encounter: Payer: Self-pay | Admitting: *Deleted

## 2016-03-02 ENCOUNTER — Ambulatory Visit
Admission: EM | Admit: 2016-03-02 | Discharge: 2016-03-02 | Disposition: A | Discharge status: Home / Self Care | Payer: BLUE CROSS/BLUE SHIELD | Attending: Family Medicine | Admitting: Family Medicine

## 2016-03-02 DIAGNOSIS — J029 Acute pharyngitis, unspecified: Secondary | ICD-10-CM | POA: Diagnosis not present

## 2016-03-02 LAB — RAPID STREP SCREEN (MED CTR MEBANE ONLY): Streptococcus, Group A Screen (Direct): NEGATIVE

## 2016-03-02 MED ORDER — IBUPROFEN 800 MG PO TABS
800.0000 mg | ORAL_TABLET | Freq: Once | ORAL | Status: AC
  Administered 2016-03-02: 800 mg via ORAL

## 2016-03-02 MED ORDER — AMOXICILLIN 875 MG PO TABS: 875.0000 mg | ORAL_TABLET | Freq: Two times a day (BID) | ORAL | 0 refills | Status: DC

## 2016-03-02 NOTE — ED Triage Notes (Signed)
Patient started having symptoms of right side ear and throat pain with body aches and fever yesterday.

## 2016-03-02 NOTE — Discharge Instructions (Signed)
Take medication as prescribed. Rest. Drink plenty of fluids.  ° °Follow up with your primary care physician this week as needed. Return to Urgent care for new or worsening concerns.  ° °

## 2016-03-02 NOTE — ED Provider Notes (Signed)
[facility] ____________________________________________  Time seen: Approximately 1248 PM  I have reviewed the triage vital signs and the nursing notes.   HISTORY  Chief Complaint Sore Throat; Fever; and Generalized Body Aches   HPI Nancy Koch is a 21 y.o. female presenting for complaints of sore throat since last night. Describes her throat as moderate, and states feels like swallowing razor blades. Also reports some accompanying right ear pain.Denies hearing changes or trauma. Denies runny nose, nasal congestion, cough. Reports some chills and body aches and reports feeling like she has a fever, denies known fever. Reports continues to drink fluids well, decreased appetite. Denies insect contacts.Reports 5 months postpartum, reports not breastfeeding, reports normal menstruals since but now on continuous oral contraceptives. Denies pregnancy, Denies known sick contacts.   Denies chest pain, shortness of breath, abdominal pain, dysuria, extremity pain, extremity swelling or rash. Denies recent sickness. Denies recent antibiotic use.   Leim Fabry, MD: PCP No LMP recorded. Patient is not currently having periods (Reason: Oral contraceptives).Denies pregnancy.    Past Medical History:  Diagnosis Date  . Appendicitis   . UTI (urinary tract infection)     Patient Active Problem List   Diagnosis Date Noted  . Pelvic pain in female 10/25/2015  . Postpartum endometritis 10/22/2015  . Active preterm labor 10/11/2015  . Acute appendicitis 08/13/2014  . Depressive disorder 12/29/2011  . Major depressive disorder, single episode 12/29/2011    Past Surgical History:  Procedure Laterality Date  . APPENDECTOMY    . LAPAROSCOPIC APPENDECTOMY N/A 08/13/2014   Procedure: APPENDECTOMY LAPAROSCOPIC;  Surgeon: Lattie Haw, MD;  Location: [facility] ORS;  Service: General;  Laterality: N/A;  . LAPAROSCOPY N/A 11/10/2014   Procedure: LAPAROSCOPY DIAGNOSTIC;  Surgeon:  Suzy Bouchard, MD;  Location: [facility] ORS;  Service: Gynecology;  Laterality: N/A;     No current facility-administered medications for this encounter.   Current Outpatient Prescriptions:  .  Prenatal Vit-Fe Fumarate-FA (MULTIVITAMIN-PRENATAL) 27-0.8 MG TABS tablet, Take 1 tablet by mouth daily at 12 noon., Disp: , Rfl:  .  amoxicillin (AMOXIL) 875 MG tablet, Take 1 tablet (875 mg total) by mouth 2 (two) times daily., Disp: 20 tablet, Rfl: 0 .  benzocaine-Menthol (DERMOPLAST) 20-0.5 % AERO, Apply 1 application topically as needed for irritation (perineal discomfort)., Disp: 1 each, Rfl: 1 .  ibuprofen (ADVIL,MOTRIN) 600 MG tablet, Take 1 tablet (600 mg total) by mouth every 6 (six) hours., Disp: 30 tablet, Rfl: 0  Allergies Patient has no known allergies.  Family History  Problem Relation Age of Onset  . Cancer Mother     cervical  . Diabetes Father   . Diabetes Sister   . Cancer Maternal Grandmother   . Depression Paternal Grandmother   . Diabetes Paternal Grandfather     Social History Social History  Substance Use Topics  . Smoking status: Former Smoker    Packs/day: 1.00    Types: Cigarettes    Quit date: 02/11/2015  . Smokeless tobacco: Never Used  . Alcohol use No    Review of Systems Constitutional: As above.  Eyes: No visual changes. ENT: Positive sore throat. Cardiovascular: Denies chest pain. Respiratory: Denies shortness of breath. Gastrointestinal: No abdominal pain.  No nausea, no vomiting.  No diarrhea.  No constipation. Genitourinary: Negative for dysuria. Musculoskeletal: Negative for back pain. Skin: Negative for rash. Neurological: Negative for headaches, focal weakness or numbness.  10-point ROS otherwise negative.  ____________________________________________   PHYSICAL EXAM:  VITAL SIGNS: ED Triage Vitals  Enc Vitals Group     BP 03/02/16 1231 118/73     Pulse Rate 03/02/16 1231 (!) 114     Resp 03/02/16 1231 16     Temp 03/02/16  1231 99.8 F (37.7 C)     Temp Source 03/02/16 1231 Oral     SpO2 03/02/16 1231 100 %     Weight 03/02/16 1232 153 lb (69.4 kg)     Height 03/02/16 1232 5\' 6"  (1.676 m)     Head Circumference --      Peak Flow --      Pain Score 03/02/16 1235 6     Pain Loc --      Pain Edu? --      Excl. in GC? --     Constitutional: Alert and oriented. Well appearing and in no acute distress. Eyes: Conjunctivae are normal. PERRL. EOMI. Head: Atraumatic. No sinus tenderness to palpation. No swelling. No erythema.  Ears: no erythema, normal TMs bilaterally. No surrounding tenderness, erythema or swelling bilaterally.  Nose:No nasal congestion or rhinorrhea.  Mouth/Throat: Mucous membranes are moist. Moderate pharyngeal erythema. No tonsillar swelling or exudate.  Neck: No stridor.  No cervical spine tenderness to palpation. Hematological/Lymphatic/Immunilogical: Mild anterior cervical lymphadenopathy. Cardiovascular: Normal rate, regular rhythm. Grossly normal heart sounds.  Good peripheral circulation. Respiratory: Normal respiratory effort.  No retractions. No wheezes, rales or rhonchi. Good air movement.  Gastrointestinal: Soft and nontender. Normal Bowel sounds. No CVA tenderness. No hepatosplenomegaly palpated. Musculoskeletal: Ambulatory with steady gait. No cervical, thoracic or lumbar tenderness to palpation. Neurologic:  Normal speech and language. No gait instability. Skin:  Skin appears warm, dry and intact. No rash noted. Psychiatric: Mood and affect are normal. Speech and behavior are normal. ___________________________________________   LABS (all labs ordered are listed, but only abnormal results are displayed)  Labs Reviewed  RAPID STREP SCREEN (NOT AT [facility])  CULTURE, GROUP A STREP [facility]    PROCEDURES Procedures   INITIAL IMPRESSION / ASSESSMENT AND PLAN / ED COURSE  Pertinent labs & imaging results that were available during my care of the patient were reviewed by me and  considered in my medical decision making (see chart for details).  Well-appearing patient. No acute distress. 800 mg ibuprofen given once in urgent care. Strep negative, will culture. Discussed with patient concern for streptococcal pharyngitis. Discussed evaluation of mono, patient declined at this time and reports will monitor. Will initiate oral amoxicillin and strep culture, patient agrees to this plan. Encouraged rest, fluids and supportive care, prn antipyretics as needed. Discussed indication, risks and benefits of medications with patient.  Discussed follow up with Primary care physician this week. Discussed follow up and return parameters including no resolution or any worsening concerns. Patient verbalized understanding and agreed to plan.   ____________________________________________   FINAL CLINICAL IMPRESSION(S) / ED DIAGNOSES  Final diagnoses:  Pharyngitis, unspecified etiology     Discharge Medication List as of 03/02/2016  1:04 PM    START taking these medications   Details  amoxicillin (AMOXIL) 875 MG tablet Take 1 tablet (875 mg total) by mouth 2 (two) times daily., Starting Wed 03/02/2016, Normal        Note: This dictation was prepared with Dragon dictation along with smaller phrase technology. Any transcriptional errors that result from this process are unintentional.         Renford DillsLindsey Bernarda Erck, NP 03/02/16 1601

## 2016-03-05 LAB — CULTURE, GROUP A STREP (THRC)

## 2017-01-14 ENCOUNTER — Other Ambulatory Visit: Payer: Self-pay

## 2017-01-14 ENCOUNTER — Ambulatory Visit
Admission: EM | Admit: 2017-01-14 | Discharge: 2017-01-14 | Disposition: A | Discharge status: Home / Self Care | Payer: BLUE CROSS/BLUE SHIELD | Attending: Emergency Medicine | Admitting: Emergency Medicine

## 2017-01-14 DIAGNOSIS — J029 Acute pharyngitis, unspecified: Secondary | ICD-10-CM

## 2017-01-14 DIAGNOSIS — H9202 Otalgia, left ear: Secondary | ICD-10-CM | POA: Diagnosis not present

## 2017-01-14 LAB — RAPID STREP SCREEN (MED CTR MEBANE ONLY): Streptococcus, Group A Screen (Direct): NEGATIVE

## 2017-01-14 MED ORDER — AMOXICILLIN-POT CLAVULANATE 875-125 MG PO TABS: 1.0000 | ORAL_TABLET | Freq: Two times a day (BID) | ORAL | 0 refills | Status: DC

## 2017-01-14 NOTE — Discharge Instructions (Signed)
Flonase daily for 3-4 weeks.  If you are not improving follow-up with your primary care physician.

## 2017-01-14 NOTE — ED Provider Notes (Signed)
[facility]    CSN: [id number] Arrival date & time: 01/14/17  0818     History   Chief Complaint Chief Complaint  Patient presents with  . Sore Throat    HPI Nancy Koch is a 22 y.o. female.   HPI  22 year old female presents with a left-sided lymph node pain left ear pain and difficulty in swallowing.  Had the symptoms for the last few weeks.  She has had no fever or chills.  She states the pain is a 5 out of 10.  No right-sided symptoms.  He has had pharyngitis in February 2018 and was treated here with amoxicillin and evidently improved.  [place]  She is also been around family that has been sick with the stomach flu and sinusitis type symptoms.  Denies any cough symptoms        Past Medical History:  Diagnosis Date  . Appendicitis   . UTI (urinary tract infection)     Patient Active Problem List   Diagnosis Date Noted  . Pelvic pain in female 10/25/2015  . Postpartum endometritis 10/22/2015  . Active preterm labor 10/11/2015  . Acute appendicitis 08/13/2014  . Depressive disorder 12/29/2011  . Major depressive disorder, single episode 12/29/2011    Past Surgical History:  Procedure Laterality Date  . APPENDECTOMY    . LAPAROSCOPIC APPENDECTOMY N/A 08/13/2014   Procedure: APPENDECTOMY LAPAROSCOPIC;  Surgeon: Lattie Hawichard E Cooper, MD;  Location: [facility] ORS;  Service: General;  Laterality: N/A;  . LAPAROSCOPY N/A 11/10/2014   Procedure: LAPAROSCOPY DIAGNOSTIC;  Surgeon: Suzy Bouchardhomas J Schermerhorn, MD;  Location: [facility] ORS;  Service: Gynecology;  Laterality: N/A;    OB History    Gravida Para Term Preterm AB Living   1 1 1     1    SAB TAB Ectopic Multiple Live Births         0 1       Home Medications    Prior to Admission medications   Medication Sig Start Date End Date Taking? Authorizing Provider  amoxicillin-clavulanate (AUGMENTIN) 875-125 MG tablet Take 1 tablet by mouth every 12 (twelve)  hours. 01/14/17   Lutricia Feiloemer, Tino Ronan P, PA-C    Family History Family History  Problem Relation Age of Onset  . Cancer Mother        cervical  . Diabetes Father   . Diabetes Sister   . Cancer Maternal Grandmother   . Depression Paternal Grandmother   . Diabetes Paternal Grandfather     Social History Social History   Tobacco Use  . Smoking status: Current Every Day Smoker    Packs/day: 1.00    Types: Cigarettes  . Smokeless tobacco: Never Used  Substance Use Topics  . Alcohol use: Yes    Comment: social  . Drug use: No     Allergies   Patient has no known allergies.   Review of Systems Review of Systems  Constitutional: Positive for activity change. Negative for chills and fatigue.  HENT: Positive for congestion, ear pain, sore throat and trouble swallowing. Negative for ear discharge.   Respiratory: Negative for cough.   All other systems reviewed and are negative.    Physical Exam Triage Vital Signs ED Triage Vitals  Enc Vitals Group     BP 01/14/17 0849 122/73     Pulse Rate 01/14/17 0849 86     Resp 01/14/17 0849 16     Temp 01/14/17 0849 98.2 F (  36.8 C)     Temp Source 01/14/17 0849 Oral     SpO2 01/14/17 0849 99 %     Weight 01/14/17 0849 155 lb (70.3 kg)     Height 01/14/17 0849 5\' 6"  (1.676 m)     Head Circumference --      Peak Flow --      Pain Score 01/14/17 0850 5     Pain Loc --      Pain Edu? --      Excl. in GC? --    No data found.  Updated Vital Signs BP 122/73 (BP Location: Right Arm)   Pulse 86   Temp 98.2 F (36.8 C) (Oral)   Resp 16   Ht 5\' 6"  (1.676 m)   Wt 155 lb (70.3 kg)   LMP 12/25/2016   SpO2 99%   BMI 25.02 kg/m   Visual Acuity Right Eye Distance:   Left Eye Distance:   Bilateral Distance:    Right Eye Near:   Left Eye Near:    Bilateral Near:     Physical Exam  Constitutional: She is oriented to person, place, and time. She appears well-developed and well-nourished.  Non-toxic appearance. She does not  appear ill. No distress.  HENT:  Head: Normocephalic.  Right Ear: Tympanic membrane normal. No drainage, swelling or tenderness. No middle ear effusion.  Left Ear: No drainage, swelling or tenderness. A middle ear effusion is present.  Mouth/Throat: Mucous membranes are normal. Oropharyngeal exudate and posterior oropharyngeal edema present. No tonsillar abscesses. Tonsils are 0 on the right. Tonsils are 1+ on the left. Tonsillar exudate.  Eyes: Pupils are equal, round, and reactive to light.  Neck: Normal range of motion. Neck supple.  Neurological: She is alert and oriented to person, place, and time.  Skin: Skin is warm and dry.  Psychiatric: She has a normal mood and affect. Her behavior is normal.  Nursing note and vitals reviewed.    UC Treatments / Results  Labs (all labs ordered are listed, but only abnormal results are displayed) Labs Reviewed  RAPID STREP SCREEN (NOT AT [facility])  CULTURE, GROUP A STREP [facility])    EKG  EKG Interpretation None       Radiology No results found.  Procedures Procedures (including critical care time)  Medications Ordered in UC Medications - No data to display   Initial Impression / Assessment and Plan / UC Course  I have reviewed the triage vital signs and the nursing notes.  Pertinent labs & imaging results that were available during my care of the patient were reviewed by me and considered in my medical decision making (see chart for details).     Plan: 1. Test/x-ray results and diagnosis reviewed with patient 2. rx as per orders; risks, benefits, potential side effects reviewed with patient 3. Recommend supportive treatment with water gargles.  Recommend using Flonase daily for 3-4 weeks.  Use lozenges or cough drops to soothe the throat.  Will empirically start her on Augmentin since her Centor score is 3/4.  If she is not improving she should follow-up with her primary care physician 4. F/u prn if symptoms worsen or don't  improve   Final Clinical Impressions(s) / UC Diagnoses   Final diagnoses:  Sore throat  Pharyngitis, unspecified etiology    ED Discharge Orders        Ordered    amoxicillin-clavulanate (AUGMENTIN) 875-125 MG tablet  Every 12 hours     01/14/17 0929  Controlled Substance Prescriptions [place] Controlled Substance Registry consulted? Not Applicable   Lutricia Feil, PA-C 01/14/17 6213

## 2017-01-14 NOTE — ED Triage Notes (Signed)
Left sided lymph node pain, left otalgia, hard to swallow. Sx x past few weeks. Pain 5/10

## 2017-01-17 LAB — CULTURE, GROUP A STREP (THRC)

## 2017-03-17 IMAGING — US US OB LIMITED
1 series · 14 of 28 positions shown · non-contrast
Comparison: None

CLINICAL DATA: Persistent colicky RIGHT lower quadrant pain,
pregnant, please assess ovaries with color Doppler ; Assigned EGA 13
weeks 6 days by first ultrasound. History appendectomy August 2014

EXAM:
LIMITED OBSTETRIC ULTRASOUND

[Series 1: us ob limited · 0.23mm/px · 14 of 38 slices shown]
[im 2/38]
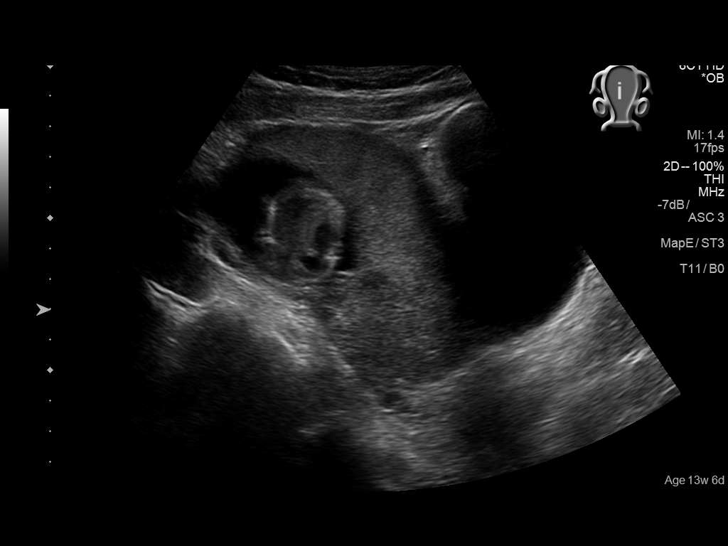
[im 5/38]
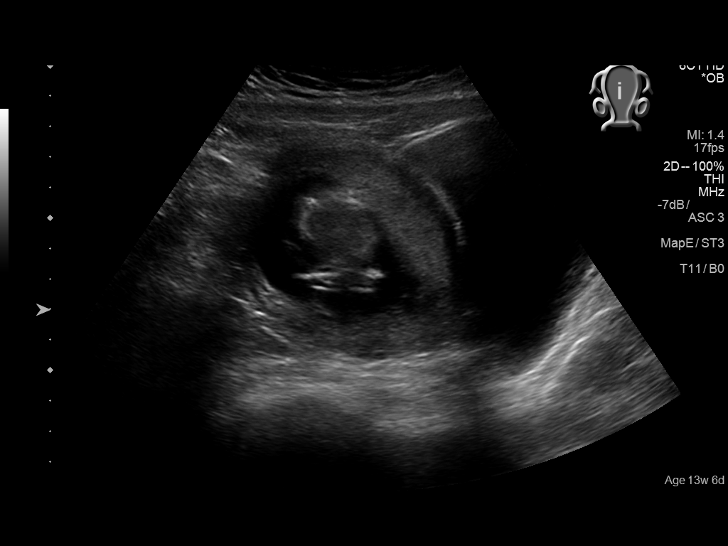
[im 7/38]
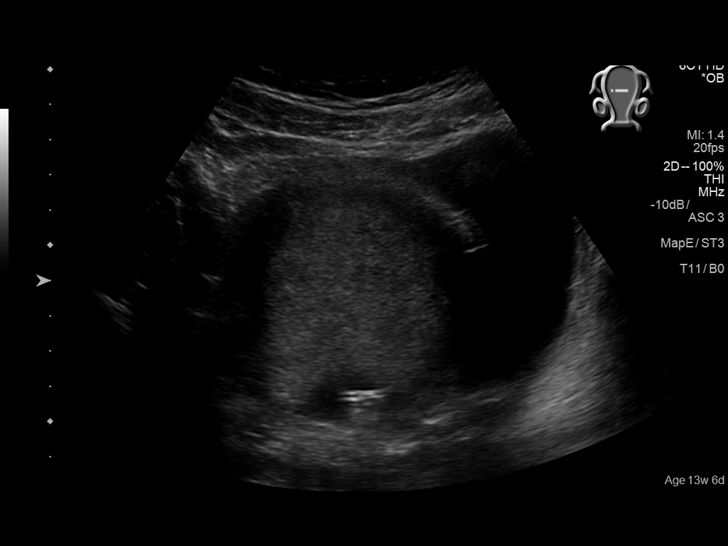
[im 10/38]
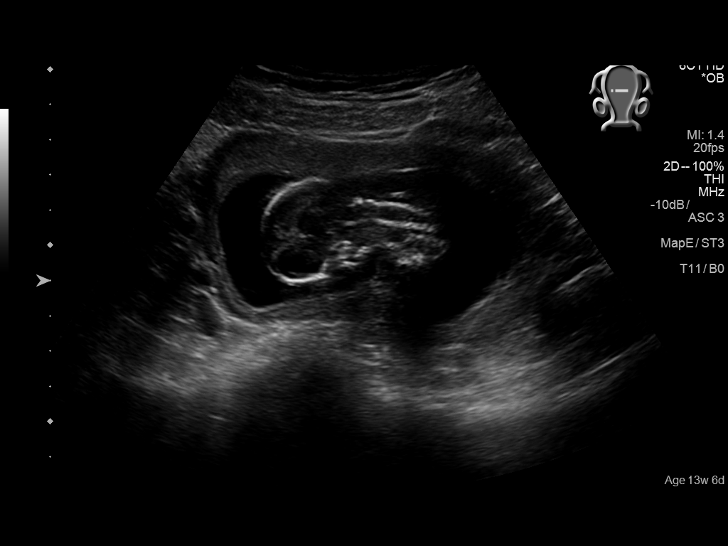
[im 13/38]
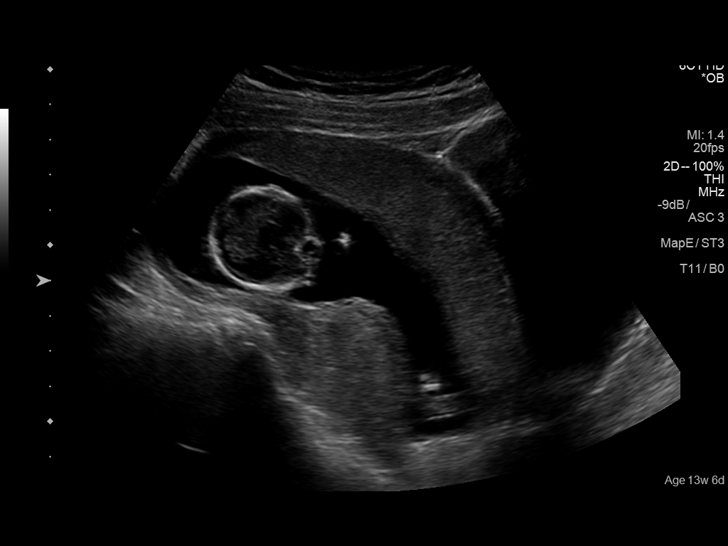
[im 16/38]
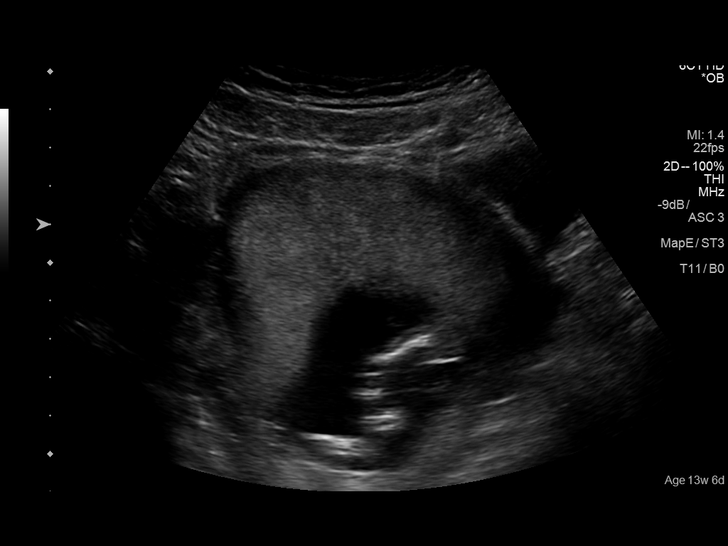
[im 18/38]
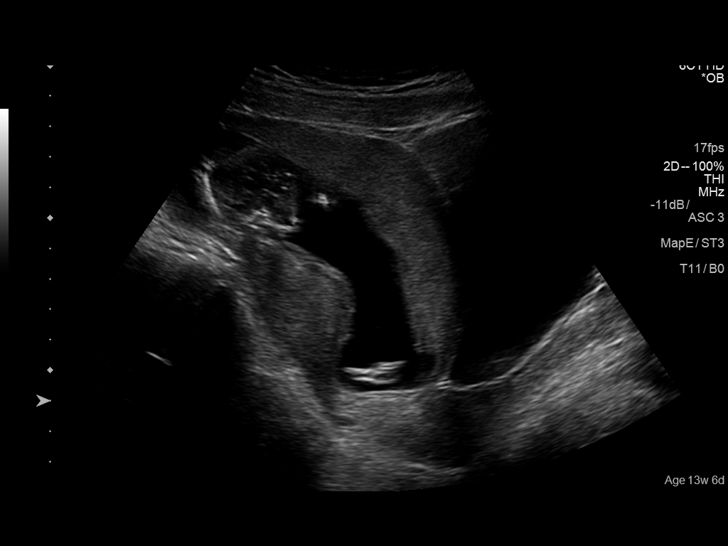
[im 21/38]
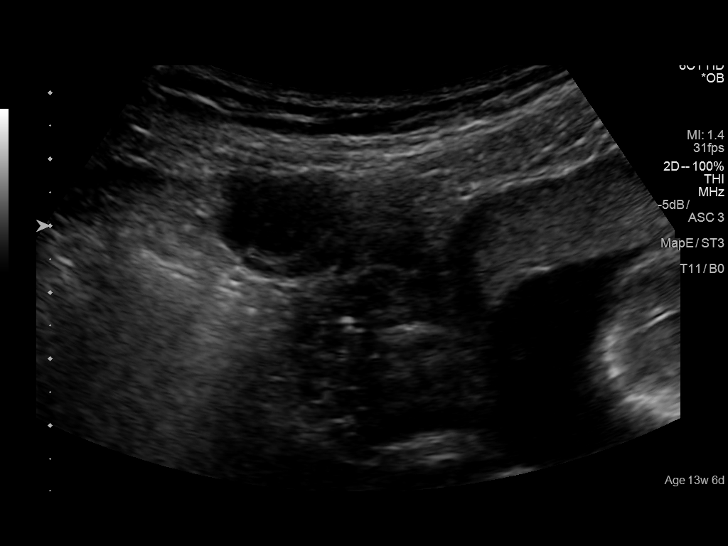
[im 24/38]
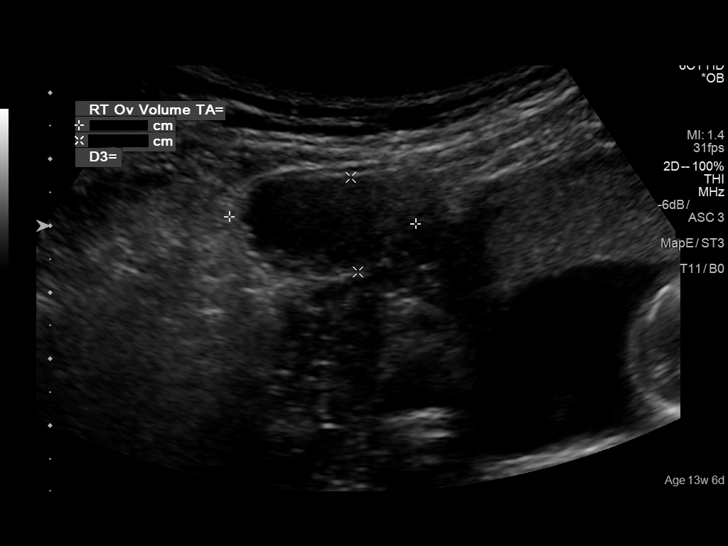
[im 27/38]
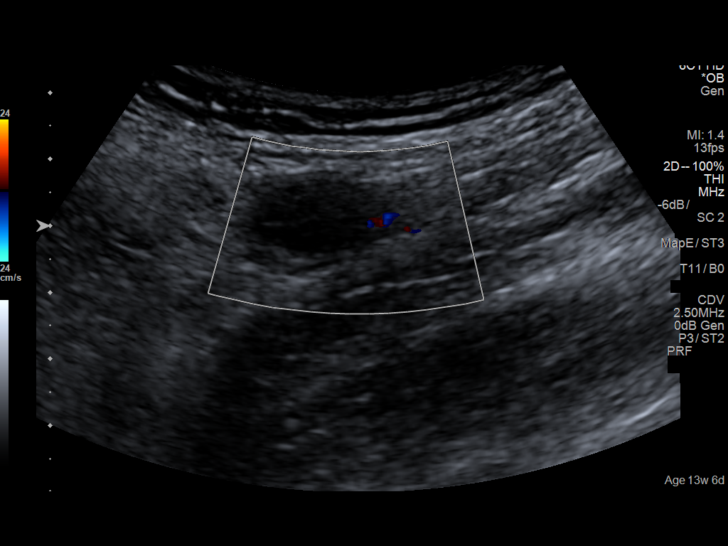
[im 29/38]
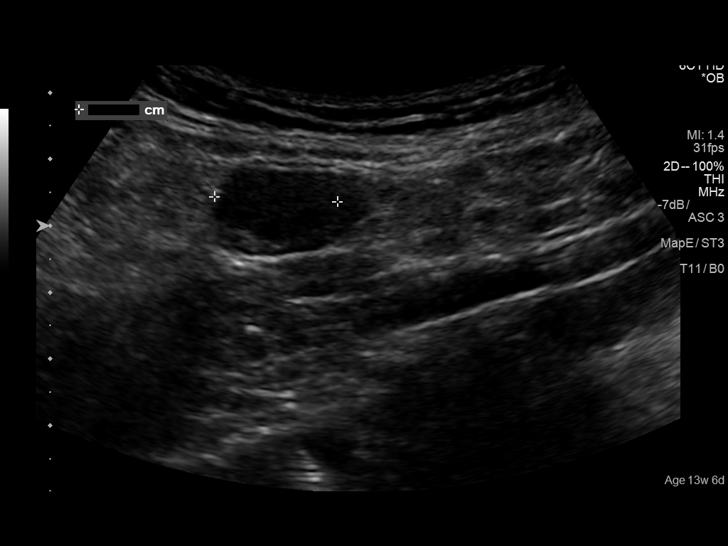
[im 32/38]
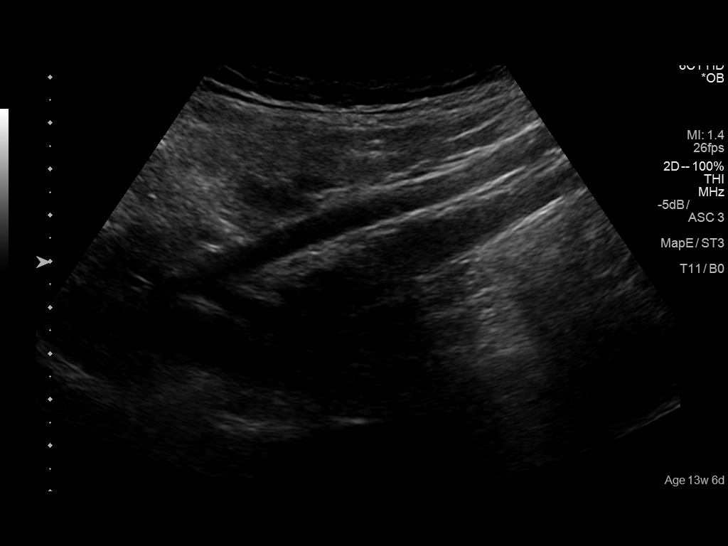
[im 35/38]
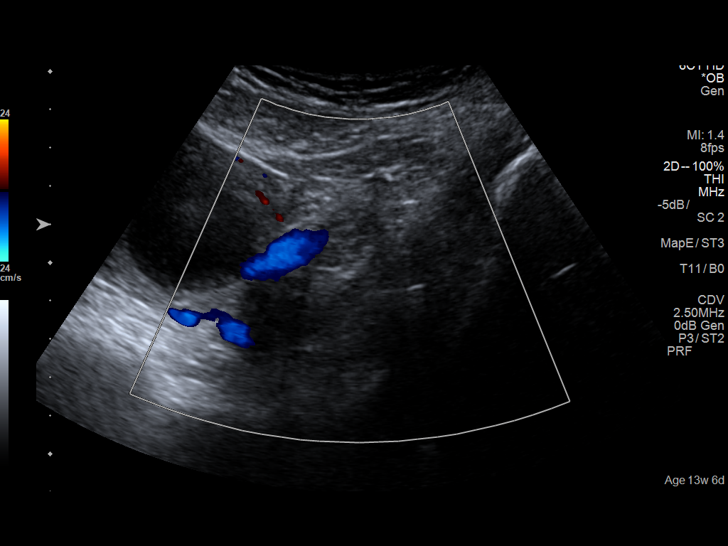
[im 38/38]
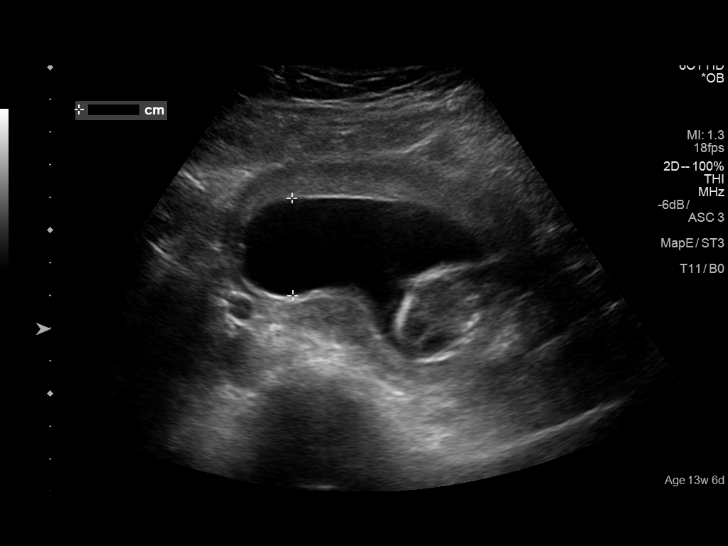

[14 of 28 positions shown; findings below may reference images not displayed]

FINDINGS: Number of Fetuses:  1

Heart Rate:  157 bpm

Presentation: Breech

Placental Location: LEFT lateral

Amniotic Fluid (Subjective):  Within normal limits.

BPD:  2.7cm 14w  5d

MATERNAL FINDINGS:

Cervix:  Appears closed.

Uterus/Adnexae:  LEFT ovary not visualized.

Uterus otherwise unremarkable.

RIGHT ovary 2.8 x 1.4 x 3.2 cm.

RIGHT ovary normal morphology without mass.

Internal blood flow present within RIGHT ovary on color Doppler
imaging.

No free fluid seen.
IMPRESSION: Single live intrauterine gestation as above.

Nonvisualization of LEFT ovary.

Normal appearing RIGHT ovary.

This exam is performed on an emergent basis and does not
comprehensively evaluate fetal size, dating, or anatomy; follow-up
complete OB US should be considered if further fetal assessment is
warranted.

## 2017-05-17 ENCOUNTER — Other Ambulatory Visit: Payer: Self-pay

## 2017-05-17 ENCOUNTER — Ambulatory Visit
Admission: EM | Admit: 2017-05-17 | Discharge: 2017-05-17 | Disposition: A | Discharge status: Home / Self Care | Payer: BLUE CROSS/BLUE SHIELD | Attending: Family Medicine | Admitting: Family Medicine

## 2017-05-17 ENCOUNTER — Encounter: Payer: Self-pay | Admitting: Emergency Medicine

## 2017-05-17 DIAGNOSIS — L03032 Cellulitis of left toe: Secondary | ICD-10-CM | POA: Diagnosis not present

## 2017-05-17 DIAGNOSIS — L0889 Other specified local infections of the skin and subcutaneous tissue: Secondary | ICD-10-CM | POA: Diagnosis not present

## 2017-05-17 MED ORDER — DOXYCYCLINE HYCLATE 100 MG PO CAPS: 100.0000 mg | ORAL_CAPSULE | Freq: Two times a day (BID) | ORAL | 0 refills | Status: DC

## 2017-05-17 MED ORDER — MUPIROCIN 2 % EX OINT: 1.0000 "application " | TOPICAL_OINTMENT | Freq: Two times a day (BID) | CUTANEOUS | 0 refills | Status: AC

## 2017-05-17 NOTE — Discharge Instructions (Signed)
Meds as prescribed.  See podiatry if no improvement.  Take care  Dr. Adriana Simas

## 2017-05-17 NOTE — ED Provider Notes (Signed)
[facility]  CSN: [id number] Arrival date & time: 05/17/17  0807  History   Chief Complaint Chief Complaint  Patient presents with  . Ingrown Toenail   HPI  22 year old female presents with a toenail infection.  Patient reports a 3-week history of a "toe infection".  She reports that it is her left great toe.  She states that the medial aspect of the nailbed has been red, slightly painful, and has been draining pus.  No recent trauma, fall, injury.  No fevers or chills.  No known exacerbating relieving factors.  No other associated symptoms.  No other complaints.  Past Medical History:  Diagnosis Date  . Appendicitis   . UTI (urinary tract infection)    Patient Active Problem List   Diagnosis Date Noted  . Pelvic pain in female 10/25/2015  . Postpartum endometritis 10/22/2015  . Active preterm labor 10/11/2015  . Acute appendicitis 08/13/2014  . Depressive disorder 12/29/2011  . Major depressive disorder, single episode 12/29/2011   Past Surgical History:  Procedure Laterality Date  . APPENDECTOMY    . LAPAROSCOPIC APPENDECTOMY N/A 08/13/2014   Procedure: APPENDECTOMY LAPAROSCOPIC;  Surgeon: Lattie Haw, MD;  Location: [facility] ORS;  Service: General;  Laterality: N/A;  . LAPAROSCOPY N/A 11/10/2014   Procedure: LAPAROSCOPY DIAGNOSTIC;  Surgeon: Suzy Bouchard, MD;  Location: [facility] ORS;  Service: Gynecology;  Laterality: N/A;   OB History    Gravida  1   Para  1   Term  1   Preterm      AB      Living  1     SAB      TAB      Ectopic      Multiple  0   Live Births  1          Home Medications    Prior to Admission medications   Medication Sig Start Date End Date Taking? Authorizing Provider  doxycycline (VIBRAMYCIN) 100 MG capsule Take 1 capsule (100 mg total) by mouth 2 (two) times daily. 05/17/17   Tommie Sams, DO  mupirocin ointment (BACTROBAN) 2 % Apply 1 application topically 2 (two) times daily for 7 days. 05/17/17 05/24/17   Tommie Sams, DO   Family History Family History  Problem Relation Age of Onset  . Cancer Mother        cervical  . Diabetes Father   . Diabetes Sister   . Cancer Maternal Grandmother   . Depression Paternal Grandmother   . Diabetes Paternal Grandfather    Social History Social History   Tobacco Use  . Smoking status: Current Every Day Smoker    Packs/day: 1.00    Types: Cigarettes  . Smokeless tobacco: Never Used  Substance Use Topics  . Alcohol use: Yes    Comment: social  . Drug use: No   Allergies   Patient has no known allergies.  Review of Systems Review of Systems  Constitutional: Negative.   Skin:       Redness, drainage around the nail bed (Left great toe).   Physical Exam Triage Vital Signs ED Triage Vitals  Enc Vitals Group     BP 05/17/17 0817 120/81     Pulse Rate 05/17/17 0817 90     Resp 05/17/17 0817 16     Temp 05/17/17 0817 98.4 F (36.9 C)     Temp Source 05/17/17 0817 Oral     SpO2 05/17/17 0817 99 %  Weight 05/17/17 0815 150 lb (68 kg)     Height 05/17/17 0815  (1.702 m)     Head Circumference --      Peak Flow --      Pain Score 05/17/17 0815 2     Pain Loc --      Pain Edu? --      Excl. in GC? --    Updated Vital Signs BP 120/81 (BP Location: Left Arm)   Pulse 90   Temp 98.4 F (36.9 C) (Oral)   Resp 16   Ht  (1.702 m)   Wt 150 lb (68 kg)   LMP 04/23/2017 (Exact Date)   SpO2 99%   BMI 23.49 kg/m  Physical Exam  Constitutional: She is oriented to person, place, and time. She appears well-developed. No distress.  HENT:  Head: Normocephalic and atraumatic.  Pulmonary/Chest: Effort normal. No respiratory distress.  Neurological: She is alert and oriented to person, place, and time.  Skin:  Left great toe -normal range of motion. Pus exuded from medial nail bed with pressure. Onychomycosis noted.  Psychiatric: She has a normal mood and affect. Her behavior is normal.  Nursing note and vitals reviewed.  UC  Treatments / Results  Labs (all labs ordered are listed, but only abnormal results are displayed) Labs Reviewed - No data to display  EKG None  Radiology No results found.  Procedures Procedures (including critical care time)  Medications Ordered in UC Medications - No data to display  Initial Impression / Assessment and Plan / UC Course  I have reviewed the triage vital signs and the nursing notes.  Pertinent labs & imaging results that were available during my care of the patient were reviewed by me and considered in my medical decision making (see chart for details).    22 year old female presents with a paronychia.  Treating with doxycycline and mupirocin.  Advised to see podiatry if fails to improve.  Final Clinical Impressions(s) / UC Diagnoses   Final diagnoses:  Paronychia of great toe of left foot    ED Prescriptions    Medication Sig Dispense Auth. Provider   doxycycline (VIBRAMYCIN) 100 MG capsule Take 1 capsule (100 mg total) by mouth 2 (two) times daily. 14 capsule Kenechukwu Eckstein G, DO   mupirocin ointment (BACTROBAN) 2 % Apply 1 application topically 2 (two) times daily for 7 days. 22 g Tommie Sams, DO     Controlled Substance Prescriptions [place] Controlled Substance Registry consulted? Not Applicable   Tommie Sams, DO 05/17/17 0901

## 2017-05-17 NOTE — ED Triage Notes (Signed)
Patient states she has had a toe infection for about 3 weeks

## 2017-05-30 ENCOUNTER — Ambulatory Visit (INDEPENDENT_AMBULATORY_CARE_PROVIDER_SITE_OTHER): Payer: BLUE CROSS/BLUE SHIELD | Admitting: Podiatry

## 2017-05-30 ENCOUNTER — Encounter: Payer: Self-pay | Admitting: Podiatry

## 2017-05-30 DIAGNOSIS — L600 Ingrowing nail: Secondary | ICD-10-CM | POA: Diagnosis not present

## 2017-05-30 MED ORDER — GENTAMICIN SULFATE 0.1 % EX CREA: 1.0000 "application " | TOPICAL_CREAM | Freq: Three times a day (TID) | CUTANEOUS | 0 refills | Status: DC

## 2017-05-30 NOTE — Patient Instructions (Signed)

## 2017-06-01 NOTE — Progress Notes (Signed)
   Subjective: 22 year old female presenting today as a new patient with a chief complaint of intermittent soreness to the left great toenail that began about 2 months ago. She states she was seen at an urgent care two weeks ago and was prescribed Doxycycline that she reports taking with no significant relief.  She also reports thickening and discoloration of the right great toenail. She reports a previous injury to the nail. She has not done anything for treatment. There are no modifying factors noted. Patient is here for further evaluation and treatment.   Past Medical History:  Diagnosis Date  . Appendicitis   . UTI (urinary tract infection)     Objective:  General: Well developed, nourished, in no acute distress, alert and oriented x3   Dermatology: Skin is warm, dry and supple bilateral. Left great toenail appears to be erythematous with evidence of an ingrowing nail. Pain on palpation noted to the border of the nail fold. The remaining nails appear unremarkable at this time. There are no open sores, lesions.  Vascular: Dorsalis Pedis artery and Posterior Tibial artery pedal pulses palpable. No lower extremity edema noted.   Neruologic: Grossly intact via light touch bilateral.  Musculoskeletal: Muscular strength within normal limits in all groups bilateral. Normal range of motion noted to all pedal and ankle joints.   Assessment:  #1 Paronychia with ingrowing nail left hallux #2 Pain in toe #3 Incurvated nail #4 Dystrophic nail of the right hallux  Plan of Care:  1. Patient evaluated.  2. Discussed treatment alternatives and plan of care. Explained nail avulsion procedure and post procedure course to patient. 3. Patient opted for total temporary nail avulsion.  4. Prior to procedure, local anesthesia infiltration utilized using 3 ml of a 50:50 mixture of 2% plain lidocaine and 0.5% plain marcaine in a normal hallux block fashion and a betadine prep performed.  5. Light dressing  applied. 6. Mechanical debridement of right great toenail performed using a nail nipper. Filed with dremel without incident.  7. Culture taken from left hallux.  8. Return to clinic in 2 weeks.   Felecia Shelling, DPM [facility]  Dr. Felecia Shelling, DPM    [address]                                        [place], [place] [address]                Office [phone]  Fax [phone]

## 2017-07-06 ENCOUNTER — Ambulatory Visit
Admission: EM | Admit: 2017-07-06 | Discharge: 2017-07-06 | Disposition: A | Discharge status: Home / Self Care | Payer: BLUE CROSS/BLUE SHIELD | Attending: Family Medicine | Admitting: Family Medicine

## 2017-07-06 ENCOUNTER — Other Ambulatory Visit: Payer: Self-pay

## 2017-07-06 DIAGNOSIS — B9789 Other viral agents as the cause of diseases classified elsewhere: Secondary | ICD-10-CM | POA: Diagnosis not present

## 2017-07-06 DIAGNOSIS — J069 Acute upper respiratory infection, unspecified: Secondary | ICD-10-CM | POA: Diagnosis not present

## 2017-07-06 MED ORDER — BENZONATATE 100 MG PO CAPS: 100.0000 mg | ORAL_CAPSULE | Freq: Three times a day (TID) | ORAL | 0 refills | Status: DC | PRN

## 2017-07-06 MED ORDER — IPRATROPIUM BROMIDE 0.06 % NA SOLN: 2.0000 | Freq: Four times a day (QID) | NASAL | 0 refills | Status: DC | PRN

## 2017-07-06 NOTE — ED Provider Notes (Signed)
[facility]  CSN: [id number] Arrival date & time: 07/06/17  1919  History   Chief Complaint Chief Complaint  Patient presents with  . Cough   HPI  22 year old female presents with respiratory symptoms.  Patient reports that her symptoms started on Sunday.  She has had cough and congestion as well as left ear pain.  Patient states that she thinks she has a respiratory infection or sinus infection.  No fever.  She is used DayQuil without improvement.  No known exacerbating factors.  Symptoms are moderate in severity.  No other associated symptoms.  No other complaints.  Past Medical History:  Diagnosis Date  . Appendicitis   . UTI (urinary tract infection)    Patient Active Problem List   Diagnosis Date Noted  . Pelvic pain in female 10/25/2015  . Postpartum endometritis 10/22/2015  . Active preterm labor 10/11/2015  . Acute appendicitis 08/13/2014  . Depressive disorder 12/29/2011  . Major depressive disorder, single episode 12/29/2011   Past Surgical History:  Procedure Laterality Date  . APPENDECTOMY    . LAPAROSCOPIC APPENDECTOMY N/A 08/13/2014   Procedure: APPENDECTOMY LAPAROSCOPIC;  Surgeon: Lattie Hawichard E Cooper, MD;  Location: [facility] ORS;  Service: General;  Laterality: N/A;  . LAPAROSCOPY N/A 11/10/2014   Procedure: LAPAROSCOPY DIAGNOSTIC;  Surgeon: Suzy Bouchardhomas J Schermerhorn, MD;  Location: [facility] ORS;  Service: Gynecology;  Laterality: N/A;   OB History    Gravida  1   Para  1   Term  1   Preterm      AB      Living  1     SAB      TAB      Ectopic      Multiple  0   Live Births  1          Home Medications    Prior to Admission medications   Medication Sig Start Date End Date Taking? Authorizing Provider  levonorgestrel-ethinyl estradiol (INTROVALE) 0.15-0.03 MG tablet TAKE 1 TABLET BY MOUTH EVERY DAY 04/09/17  Yes [provider]  benzonatate (TESSALON) 100 MG capsule Take 1 capsule (100 mg total) by mouth 3 (three) times daily as  needed. 07/06/17   Everlene Otherook, Zamiyah Resendes G, DO  ipratropium (ATROVENT) 0.06 % nasal spray Place 2 sprays into both nostrils 4 (four) times daily as needed for rhinitis. 07/06/17   Tommie Samsook, Jaskarn Schweer G, DO   Family History Family History  Problem Relation Age of Onset  . Cancer Mother        cervical  . Diabetes Father   . Diabetes Sister   . Cancer Maternal Grandmother   . Depression Paternal Grandmother   . Diabetes Paternal Grandfather    Social History Social History   Tobacco Use  . Smoking status: Current Every Day Smoker    Packs/day: 1.00    Types: Cigarettes  . Smokeless tobacco: Never Used  Substance Use Topics  . Alcohol use: Yes    Comment: social  . Drug use: No   Allergies   Patient has no known allergies.   Review of Systems Review of Systems  Constitutional: Negative for fever.  HENT: Positive for congestion and ear pain.   Respiratory: Positive for cough.    Physical Exam Triage Vital Signs ED Triage Vitals  Enc Vitals Group     BP 07/06/17 1939 123/83     Pulse Rate 07/06/17 1939 69     Resp 07/06/17 1939 18     Temp 07/06/17 1939 98.4 F (  36.9 C)     Temp Source 07/06/17 1939 Oral     SpO2 07/06/17 1939 100 %     Weight 07/06/17 1938 150 lb (68 kg)     Height 07/06/17 1938 5\' 7"  (1.702 m)     Head Circumference --      Peak Flow --      Pain Score 07/06/17 1937 2     Pain Loc --      Pain Edu? --      Excl. in GC? --    Updated Vital Signs BP 123/83 (BP Location: Left Arm)   Pulse 69   Temp 98.4 F (36.9 C) (Oral)   Resp 18   Ht 5\' 7"  (1.702 m)   Wt 150 lb (68 kg)   LMP 07/04/2017   SpO2 100%   Breastfeeding? Yes   BMI 23.49 kg/m      Physical Exam  Constitutional: She is oriented to person, place, and time. She appears well-developed. No distress.  HENT:  Head: Normocephalic and atraumatic.  Mouth/Throat: Oropharynx is clear and moist.  Normal TM's bilaterally.  Eyes: Conjunctivae are normal. Right eye exhibits no discharge. Left eye  exhibits no discharge.  Cardiovascular: Normal rate and regular rhythm.  Pulmonary/Chest: Effort normal and breath sounds normal. She has no wheezes. She has no rales.  Neurological: She is alert and oriented to person, place, and time.  Psychiatric: She has a normal mood and affect. Her behavior is normal.  Nursing note and vitals reviewed.  UC Treatments / Results  Labs (all labs ordered are listed, but only abnormal results are displayed) Labs Reviewed - No data to display  EKG None  Radiology No results found.  Procedures Procedures (including critical care time)  Medications Ordered in UC Medications - No data to display  Initial Impression / Assessment and Plan / UC Course  I have reviewed the triage vital signs and the nursing notes.  Pertinent labs & imaging results that were available during my care of the patient were reviewed by me and considered in my medical decision making (see chart for details).    22 year old female presents with a viral URI with cough.  Treating with Tessalon Perles and Atrovent nasal spray.  Supportive care.  Final Clinical Impressions(s) / UC Diagnoses   Final diagnoses:  Viral URI with cough     Discharge Instructions     This is viral.  Use the medications as prescribed.  Things will improve in the next few days.  Take care  Dr. Adriana Simas     ED Prescriptions    Medication Sig Dispense Auth. Provider   benzonatate (TESSALON) 100 MG capsule Take 1 capsule (100 mg total) by mouth 3 (three) times daily as needed. 30 capsule Terrye Dombrosky G, DO   ipratropium (ATROVENT) 0.06 % nasal spray Place 2 sprays into both nostrils 4 (four) times daily as needed for rhinitis. 15 mL Tommie Sams, DO     Controlled Substance Prescriptions [place] Controlled Substance Registry consulted? Not Applicable   Tommie Sams, DO 07/06/17 2041

## 2017-07-06 NOTE — ED Triage Notes (Signed)
Patient complains of cough and congestion with runny nose, sinus pain and pressure, left ear pain x Sunday.

## 2017-07-06 NOTE — Discharge Instructions (Signed)
This is viral.  Use the medications as prescribed.  Things will improve in the next few days.  Take care  Dr. Adriana Simasook

## 2017-09-12 ENCOUNTER — Other Ambulatory Visit: Payer: Self-pay

## 2017-09-12 ENCOUNTER — Encounter: Payer: Self-pay | Admitting: Emergency Medicine

## 2017-09-12 ENCOUNTER — Ambulatory Visit
Admission: EM | Admit: 2017-09-12 | Discharge: 2017-09-12 | Disposition: A | Discharge status: Home / Self Care | Payer: BLUE CROSS/BLUE SHIELD | Attending: Family Medicine | Admitting: Family Medicine

## 2017-09-12 DIAGNOSIS — R300 Dysuria: Secondary | ICD-10-CM

## 2017-09-12 DIAGNOSIS — N3090 Cystitis, unspecified without hematuria: Secondary | ICD-10-CM | POA: Diagnosis not present

## 2017-09-12 DIAGNOSIS — R350 Frequency of micturition: Secondary | ICD-10-CM

## 2017-09-12 LAB — URINALYSIS, COMPLETE (UACMP) WITH MICROSCOPIC
Bilirubin Urine: NEGATIVE
Glucose, UA: NEGATIVE mg/dL
KETONES UR: NEGATIVE mg/dL
Leukocytes, UA: NEGATIVE
Nitrite: NEGATIVE
PROTEIN: NEGATIVE mg/dL
SPECIFIC GRAVITY, URINE: 1.02 (ref 1.005–1.030)
pH: 7 (ref 5.0–8.0)

## 2017-09-12 MED ORDER — CEPHALEXIN 500 MG PO CAPS: 500.0000 mg | ORAL_CAPSULE | Freq: Two times a day (BID) | ORAL | 0 refills | Status: DC

## 2017-09-12 NOTE — ED Provider Notes (Signed)
[facility]    CSN: [id number] Arrival date & time: 09/12/17  0804     History   Chief Complaint Chief Complaint  Patient presents with  . Dysuria    HPI Nancy Koch is a 22 y.o. female.    Dysuria  Pain quality:  Burning Pain severity:  Moderate Onset quality:  Sudden Duration:  7 days Timing:  Constant Progression:  Worsening Chronicity:  New Recent urinary tract infections: no   Relieved by:  Nothing Ineffective treatments: patient states she took some Macrobid that she had left over. Urinary symptoms: frequent urination   Associated symptoms: no abdominal pain, no fever, no flank pain, no genital lesions, no nausea, no vaginal discharge and no vomiting   Risk factors: no hx of pyelonephritis, no hx of urolithiasis, no kidney transplant, not pregnant, no recurrent urinary tract infections, no renal cysts, no renal disease, no sexually transmitted infections, no single kidney and no urinary catheter     Past Medical History:  Diagnosis Date  . Appendicitis   . UTI (urinary tract infection)     Patient Active Problem List   Diagnosis Date Noted  . Pelvic pain in female 10/25/2015  . Postpartum endometritis 10/22/2015  . Active preterm labor 10/11/2015  . Acute appendicitis 08/13/2014  . Depressive disorder 12/29/2011  . Major depressive disorder, single episode 12/29/2011    Past Surgical History:  Procedure Laterality Date  . APPENDECTOMY    . LAPAROSCOPIC APPENDECTOMY N/A 08/13/2014   Procedure: APPENDECTOMY LAPAROSCOPIC;  Surgeon: Lattie Haw, MD;  Location: [facility];  Service: General;  Laterality: N/A;  . LAPAROSCOPY N/A 11/10/2014   Procedure: LAPAROSCOPY DIAGNOSTIC;  Surgeon: Suzy Bouchard, MD;  Location: [facility];  Service: Gynecology;  Laterality: N/A;    OB History    Gravida  1   Para  1   Term  1   Preterm      AB      Living  1     SAB      TAB      Ectopic      Multiple  0   Live Births  1           Home Medications    Prior to Admission medications   Medication Sig Start Date End Date Taking? Authorizing Provider  benzonatate (TESSALON) 100 MG capsule Take 1 capsule (100 mg total) by mouth 3 (three) times daily as needed. 07/06/17   Tommie Sams, DO  cephALEXin (KEFLEX) 500 MG capsule Take 1 capsule (500 mg total) by mouth 2 (two) times daily. 09/12/17   Payton Mccallum, MD  ipratropium (ATROVENT) 0.06 % nasal spray Place 2 sprays into both nostrils 4 (four) times daily as needed for rhinitis. 07/06/17   Tommie Sams, DO  levonorgestrel-ethinyl estradiol (INTROVALE) 0.15-0.03 MG tablet TAKE 1 TABLET BY MOUTH EVERY DAY 04/09/17   [provider]    Family History Family History  Problem Relation Age of Onset  . Cancer Mother        cervical  . Diabetes Father   . Diabetes Sister   . Cancer Maternal Grandmother   . Depression Paternal Grandmother   . Diabetes Paternal Grandfather     Social History Social History   Tobacco Use  . Smoking status: Former Smoker    Packs/day: 1.00    Types: Cigarettes  . Smokeless tobacco: Never Used  Substance Use Topics  . Alcohol use: Yes    Comment: social  .  Drug use: No     Allergies   Patient has no known allergies.   Review of Systems Review of Systems  Constitutional: Negative for fever.  Gastrointestinal: Negative for abdominal pain, nausea and vomiting.  Genitourinary: Positive for dysuria. Negative for flank pain and vaginal discharge.     Physical Exam Triage Vital Signs ED Triage Vitals  Enc Vitals Group     BP 09/12/17 0823 124/71     Pulse Rate 09/12/17 0823 80     Resp 09/12/17 0823 16     Temp 09/12/17 0823 98.2 F (36.8 C)     Temp Source 09/12/17 0823 Oral     SpO2 09/12/17 0823 98 %     Weight 09/12/17 0820 164 lb (74.4 kg)     Height 09/12/17 0820 5\' 6"  (1.676 m)     Head Circumference --      Peak Flow --      Pain Score 09/12/17 0820 5     Pain Loc --      Pain Edu? --       Excl. in GC? --    No data found.  Updated Vital Signs BP 124/71 (BP Location: Right Arm)   Pulse 80   Temp 98.2 F (36.8 C) (Oral)   Resp 16   Ht 5\' 6"  (1.676 m)   Wt 74.4 kg   SpO2 98%   Breastfeeding? No   BMI 26.47 kg/m   Visual Acuity Right Eye Distance:   Left Eye Distance:   Bilateral Distance:    Right Eye Near:   Left Eye Near:    Bilateral Near:     Physical Exam  Constitutional: She appears well-developed and well-nourished. No distress.  Abdominal: Soft. Bowel sounds are normal. She exhibits no distension and no mass. There is no tenderness. There is no rebound and no guarding.  Skin: She is not diaphoretic.  Nursing note and vitals reviewed.    UC Treatments / Results  Labs (all labs ordered are listed, but only abnormal results are displayed) Labs Reviewed  URINALYSIS, COMPLETE (UACMP) WITH MICROSCOPIC - Abnormal; Notable for the following components:      Result Value   Hgb urine dipstick TRACE (*)    Bacteria, UA RARE (*)    All other components within normal limits  URINE CULTURE    EKG None  Radiology No results found.  Procedures Procedures (including critical care time)  Medications Ordered in UC Medications - No data to display  Initial Impression / Assessment and Plan / UC Course  I have reviewed the triage vital signs and the nursing notes.  Pertinent labs & imaging results that were available during my care of the patient were reviewed by me and considered in my medical decision making (see chart for details).      Final Clinical Impressions(s) / UC Diagnoses   Final diagnoses:  Cystitis     Discharge Instructions     Over the counter advil/motrin and AZO (pyridium) as needed Increase water intake    ED Prescriptions    Medication Sig Dispense Auth. Provider   cephALEXin (KEFLEX) 500 MG capsule Take 1 capsule (500 mg total) by mouth 2 (two) times daily. 6 capsule Payton Mccallum, MD      1. Lab results and  diagnosis reviewed with patient 2. rx as per orders above; reviewed possible side effects, interactions, risks and benefits  3. Recommend supportive treatment as above 4. Follow-up prn if symptoms worsen or don't improve  Controlled Substance Prescriptions [place] Controlled Substance Registry consulted? Not Applicable   Payton Mccallum, MD 09/12/17 [phone]

## 2017-09-12 NOTE — ED Triage Notes (Signed)
Patient c/o burning when urinating and increase in urinary frequency and urgency that started over a week ago.  Patient states that she started an old prescription of Macrobid last Wed and took about 10 of those pills.

## 2017-09-12 NOTE — Discharge Instructions (Signed)
Over the counter advil/motrin and AZO (pyridium) as needed Increase water intake

## 2017-09-13 LAB — URINE CULTURE
Culture: NO GROWTH
SPECIAL REQUESTS: NORMAL

## 2018-02-15 ENCOUNTER — Encounter: Payer: Self-pay | Admitting: Emergency Medicine

## 2018-02-15 ENCOUNTER — Ambulatory Visit
Admission: EM | Admit: 2018-02-15 | Discharge: 2018-02-15 | Disposition: A | Discharge status: Home / Self Care | Payer: BLUE CROSS/BLUE SHIELD | Attending: Family Medicine | Admitting: Family Medicine

## 2018-02-15 ENCOUNTER — Other Ambulatory Visit: Payer: Self-pay

## 2018-02-15 DIAGNOSIS — B9789 Other viral agents as the cause of diseases classified elsewhere: Secondary | ICD-10-CM

## 2018-02-15 DIAGNOSIS — J069 Acute upper respiratory infection, unspecified: Secondary | ICD-10-CM

## 2018-02-15 DIAGNOSIS — R05 Cough: Secondary | ICD-10-CM | POA: Diagnosis not present

## 2018-02-15 DIAGNOSIS — J029 Acute pharyngitis, unspecified: Secondary | ICD-10-CM

## 2018-02-15 DIAGNOSIS — R0981 Nasal congestion: Secondary | ICD-10-CM

## 2018-02-15 LAB — RAPID STREP SCREEN (MED CTR MEBANE ONLY): Streptococcus, Group A Screen (Direct): NEGATIVE

## 2018-02-15 MED ORDER — OSELTAMIVIR PHOSPHATE 75 MG PO CAPS: 75.0000 mg | ORAL_CAPSULE | Freq: Two times a day (BID) | ORAL | 0 refills | Status: DC

## 2018-02-15 MED ORDER — BENZONATATE 200 MG PO CAPS: ORAL_CAPSULE | ORAL | 0 refills | Status: DC

## 2018-02-15 NOTE — ED Triage Notes (Signed)
Patient c/o sore throat, nasal congestion, body aches and headache that started this morning.

## 2018-02-15 NOTE — Discharge Instructions (Addendum)
Drink plenty of fluids.  Rest as much as possible.  Use Tylenol or Motrin for fever chills or body aches.  °

## 2018-02-15 NOTE — ED Provider Notes (Signed)
[facility]    CSN: [id number] Arrival date & time: 02/15/18  0809     History   Chief Complaint Chief Complaint  Patient presents with  . Sore Throat  . Generalized Body Aches    HPI Nancy Koch is a 23 y.o. female.   HPI   23 year old female presents with sore throat nasal congestion body aches and headache that started this morning.  States that she is has an occasional cough.  Did receive her flu shot about October.  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  Her problem feels like its in her sinuses with drainage which is causing a sore throat.  Not had fever.  Is 98 degrees pulse rate 81 blood pressure 107/80 respirations 18 and O2 sats at 97%.        Past Medical History:  Diagnosis Date  . Appendicitis   . UTI (urinary tract infection)     Patient Active Problem List   Diagnosis Date Noted  . Pelvic pain in female 10/25/2015  . Postpartum endometritis 10/22/2015  . Active preterm labor 10/11/2015  . Acute appendicitis 08/13/2014  . Depressive disorder 12/29/2011  . Major depressive disorder, single episode 12/29/2011    Past Surgical History:  Procedure Laterality Date  . APPENDECTOMY    . LAPAROSCOPIC APPENDECTOMY N/A 08/13/2014   Procedure: APPENDECTOMY LAPAROSCOPIC;  Surgeon: Lattie Haw, MD;  Location: [facility];  Service: General;  Laterality: N/A;  . LAPAROSCOPY N/A 11/10/2014   Procedure: LAPAROSCOPY DIAGNOSTIC;  Surgeon: Suzy Bouchard, MD;  Location: [facility];  Service: Gynecology;  Laterality: N/A;    OB History    Gravida  1   Para  1   Term  1   Preterm      AB      Living  1     SAB      TAB      Ectopic      Multiple  0   Live Births  1            Home Medications    Prior to Admission medications   Medication Sig Start Date End Date Taking? Authorizing Provider  benzonatate (TESSALON) 100 MG capsule Take 1 capsule (100 mg  total) by mouth 3 (three) times daily as needed. 07/06/17   Tommie Sams, DO  benzonatate (TESSALON) 200 MG capsule Take one cap TID PRN cough 02/15/18   Lutricia Feil, PA-C  oseltamivir (TAMIFLU) 75 MG capsule Take 1 capsule (75 mg total) by mouth every 12 (twelve) hours. 02/15/18   Lutricia Feil, PA-C    Family History Family History  Problem Relation Age of Onset  . Cancer Mother        cervical  . Diabetes Father   . Diabetes Sister   . Cancer Maternal Grandmother   . Depression Paternal Grandmother   . Diabetes Paternal Grandfather     Social History Social History   Tobacco Use  . Smoking status: Former Smoker    Packs/day: 1.00    Types: Cigarettes  . Smokeless tobacco: Never Used  Substance Use Topics  . Alcohol use: Yes    Comment: social  . Drug use: No     Allergies   Patient has no known allergies.   Review of Systems Review of Systems  Constitutional: Positive for activity change. Negative for appetite change, chills, fatigue and  fever.  HENT: Positive for congestion, postnasal drip, sinus pressure and sore throat.   Respiratory: Positive for cough.   All other systems reviewed and are negative.    Physical Exam Triage Vital Signs ED Triage Vitals  Enc Vitals Group     BP 02/15/18 0824 107/80     Pulse Rate 02/15/18 0824 81     Resp 02/15/18 0824 18     Temp 02/15/18 0824 98 F (36.7 C)     Temp Source 02/15/18 0824 Oral     SpO2 02/15/18 0824 97 %     Weight 02/15/18 0822 160 lb (72.6 kg)     Height 02/15/18 0822 5\' 6"  (1.676 m)     Head Circumference --      Peak Flow --      Pain Score 02/15/18 0822 6     Pain Loc --      Pain Edu? --      Excl. in GC? --    No data found.  Updated Vital Signs BP 107/80 (BP Location: Left Arm)   Pulse 81   Temp 98 F (36.7 C) (Oral)   Resp 18   Ht 5\' 6"  (1.676 m)   Wt 160 lb (72.6 kg)   LMP 02/01/2018   SpO2 97%   BMI 25.82 kg/m   Visual Acuity Right Eye Distance:   Left Eye  Distance:   Bilateral Distance:    Right Eye Near:   Left Eye Near:    Bilateral Near:     Physical Exam Vitals signs and nursing note reviewed.  Constitutional:      General: She is not in acute distress.    Appearance: She is well-developed and normal weight. She is not ill-appearing, toxic-appearing or diaphoretic.  HENT:     Head: Normocephalic and atraumatic.     Right Ear: Tympanic membrane and ear canal normal.     Left Ear: Tympanic membrane and ear canal normal.     Nose: No congestion or rhinorrhea.     Mouth/Throat:     Mouth: Mucous membranes are moist.     Pharynx: Oropharynx is clear.     Tonsils: No tonsillar exudate or tonsillar abscesses. Swelling: 0 on the right. 0 on the left.  Eyes:     Conjunctiva/sclera: Conjunctivae normal.  Neck:     Musculoskeletal: Normal range of motion and neck supple.  Pulmonary:     Effort: Pulmonary effort is normal.     Breath sounds: Normal breath sounds.  Lymphadenopathy:     Cervical: No cervical adenopathy.  Skin:    General: Skin is warm and dry.  Neurological:     General: No focal deficit present.     Mental Status: She is alert and oriented to person, place, and time.  Psychiatric:        Mood and Affect: Mood normal.        Behavior: Behavior normal.      UC Treatments / Results  Labs (all labs ordered are listed, but only abnormal results are displayed) Labs Reviewed  RAPID STREP SCREEN ([facility] ONLY)  CULTURE, GROUP A STREP [facility])    EKG None  Radiology No results found.  Procedures Procedures (including critical care time)  Medications Ordered in UC Medications - No data to display  Initial Impression / Assessment and Plan / UC Course  I have reviewed the triage vital signs and the nursing notes.  Pertinent labs & imaging results that were available  during my care of the patient were reviewed by me and considered in my medical decision making (see chart for details).   Patient likely  has an upper respiratory infection.  Sx did just  Start today it is possible that she may have symptoms of the flu not completely manifest.  Told her that since she has had already had her flu shot that if she gets the flu which should be of shorter duration and less severe.  I will provide her with a prescription for Tamiflu that she can have filled if those symptoms develop.  Otherwise she will not take it.  I will also give her prescription for Tessalon Perles.  He worsens and is not seeming to improve she may return to our clinic or follow-up with her primary care physician   Final Clinical Impressions(s) / UC Diagnoses   Final diagnoses:  Upper respiratory tract infection, unspecified type     Discharge Instructions     Drink plenty of fluids.  Rest as much as possible.  Use Tylenol or Motrin for fever chills or body aches.    ED Prescriptions    Medication Sig Dispense Auth. Provider   oseltamivir (TAMIFLU) 75 MG capsule Take 1 capsule (75 mg total) by mouth every 12 (twelve) hours. 10 capsule Lutricia Feil, PA-C   benzonatate (TESSALON) 200 MG capsule Take one cap TID PRN cough 30 capsule Lutricia Feil, PA-C     Controlled Substance Prescriptions [place] Controlled Substance Registry consulted? Not Applicable   Lutricia Feil, PA-C 02/15/18 2233

## 2018-02-18 LAB — CULTURE, GROUP A STREP (THRC)

## 2018-07-31 ENCOUNTER — Ambulatory Visit (INDEPENDENT_AMBULATORY_CARE_PROVIDER_SITE_OTHER): Payer: Managed Care, Other (non HMO) | Admitting: Family Medicine

## 2018-07-31 ENCOUNTER — Encounter: Payer: Self-pay | Admitting: Family Medicine

## 2018-07-31 ENCOUNTER — Other Ambulatory Visit: Payer: Self-pay

## 2018-07-31 VITALS — BP 120/70 | HR 64 | Ht 66.0 in | Wt 164.0 lb

## 2018-07-31 DIAGNOSIS — Z3041 Encounter for surveillance of contraceptive pills: Secondary | ICD-10-CM

## 2018-07-31 DIAGNOSIS — Z7689 Persons encountering health services in other specified circumstances: Secondary | ICD-10-CM | POA: Diagnosis not present

## 2018-07-31 DIAGNOSIS — Z716 Tobacco abuse counseling: Secondary | ICD-10-CM

## 2018-07-31 MED ORDER — CHANTIX STARTING MONTH PAK 0.5 MG X 11 & 1 MG X 42 PO TABS: ORAL_TABLET | ORAL | 0 refills | Status: DC

## 2018-07-31 MED ORDER — NICOTINE 21 MG/24HR TD PT24: 21.0000 mg | MEDICATED_PATCH | Freq: Every day | TRANSDERMAL | 0 refills | Status: DC

## 2018-07-31 MED ORDER — LEVONORGEST-ETH ESTRAD 91-DAY 0.15-0.03 MG PO TABS: 1.0000 | ORAL_TABLET | Freq: Every day | ORAL | 3 refills | Status: DC

## 2018-07-31 NOTE — Progress Notes (Signed)
Date:  07/31/2018   Name:  Nancy Koch   DOB:  1995-04-28   MRN:  [id number]   Chief Complaint: Establish Care and Nicotine Dependence (quit for a couple of months- started back in March)  Nicotine Dependence Presents for initial visit. Symptoms include cravings. Symptoms are negative for fatigue and sore throat. Preferred tobacco types include cigarettes. Preferred cigarette types include filtered. Preferred strength is regular. Preferred cigarettes are non-menthol. Preferred brands include Marlboro. Her urge triggers include company of smokers, drinking coffee and meal time. She smokes 1 pack of cigarettes per day. She started smoking when she was [age] ([age]) years old. Past treatments include nothing. Nancy Koch is ready to quit.  Gynecologic Exam The patient's pertinent negatives include no pelvic pain or vaginal discharge. Primary symptoms comment: contraceptive managrmrnt. Episode onset: on meds for 6 months off  Pertinent negatives include no abdominal pain, back pain, chills, constipation, diarrhea, dysuria, fever, flank pain, frequency, headaches, hematuria, nausea, rash, sore throat, urgency or vomiting.    Review of Systems  Constitutional: Negative.  Negative for chills, fatigue, fever and unexpected weight change.  HENT: Negative for congestion, ear discharge, ear pain, rhinorrhea, sinus pressure, sneezing and sore throat.   Eyes: Negative for photophobia, pain, discharge, redness and itching.  Respiratory: Negative for cough, shortness of breath, wheezing and stridor.   Gastrointestinal: Negative for abdominal pain, blood in stool, constipation, diarrhea, nausea and vomiting.  Endocrine: Negative for cold intolerance, heat intolerance, polydipsia, polyphagia and polyuria.  Genitourinary: Negative for dysuria, flank pain, frequency, hematuria, menstrual problem, pelvic pain, urgency, vaginal bleeding and vaginal discharge.  Musculoskeletal: Negative for arthralgias, back pain and  myalgias.  Skin: Negative for rash.  Allergic/Immunologic: Negative for environmental allergies and food allergies.  Neurological: Negative for dizziness, weakness, light-headedness, numbness and headaches.  Hematological: Negative for adenopathy. Does not bruise/bleed easily.  Psychiatric/Behavioral: Negative for dysphoric mood. The patient is not nervous/anxious.     Patient Active Problem List   Diagnosis Date Noted  . Pelvic pain in female 10/25/2015  . Postpartum endometritis 10/22/2015  . Active preterm labor 10/11/2015  . Acute appendicitis 08/13/2014  . Depressive disorder 12/29/2011  . Major depressive disorder, single episode 12/29/2011    No Known Allergies  Past Surgical History:  Procedure Laterality Date  . APPENDECTOMY    . LAPAROSCOPIC APPENDECTOMY N/A 08/13/2014   Procedure: APPENDECTOMY LAPAROSCOPIC;  Surgeon: Lattie Hawichard E Cooper, MD;  Location: [facility];  Service: General;  Laterality: N/A;  . LAPAROSCOPY N/A 11/10/2014   Procedure: LAPAROSCOPY DIAGNOSTIC;  Surgeon: Suzy Bouchardhomas J Schermerhorn, MD;  Location: [facility];  Service: Gynecology;  Laterality: N/A;    Social History   Tobacco Use  . Smoking status: Current Every Day Smoker    Packs/day: 1.00    Types: Cigarettes  . Smokeless tobacco: Never Used  Substance Use Topics  . Alcohol use: Yes    Comment: social  . Drug use: No     Medication list has been reviewed and updated.  Current Meds  Medication Sig  . chlorhexidine (PERIDEX) 0.12 % solution RINSE MOUTH WITH 15 ML FOR 30 SECONDS IN THE MORNING AND NIGHT AFTER BRUSHING TEETH. SPIT OUT. DO NOT SWALLOW  . HYDROcodone-acetaminophen (NORCO/VICODIN) 5-325 MG tablet TK 1 T PO Q 4 TO 6 H PRN P  . ibuprofen (ADVIL) 400 MG tablet TK 1 T PO TID PRN  . levonorgestrel-ethinyl estradiol (INTROVALE) 0.15-0.03 MG tablet Take 1 tablet by mouth daily.    PHQ 2/9 Scores 07/31/2018  PHQ - 2 Score 0  PHQ- 9 Score 0    BP Readings from Last 3 Encounters:   07/31/18 120/70  02/15/18 107/80  09/12/17 124/71    Physical Exam Vitals signs and nursing note reviewed.  Constitutional:      Appearance: She is well-developed.  HENT:     Head: Normocephalic.     Right Ear: Tympanic membrane, ear canal and external ear normal.     Left Ear: Tympanic membrane, ear canal and external ear normal.  Eyes:     General: Lids are everted, no foreign bodies appreciated. No scleral icterus.       Left eye: No foreign body or hordeolum.     Conjunctiva/sclera: Conjunctivae normal.     Right eye: Right conjunctiva is not injected.     Left eye: Left conjunctiva is not injected.     Pupils: Pupils are equal, round, and reactive to light.  Neck:     Musculoskeletal: Normal range of motion and neck supple.     Thyroid: No thyromegaly.     Vascular: No JVD.     Trachea: No tracheal deviation.  Cardiovascular:     Rate and Rhythm: Normal rate and regular rhythm.     Heart sounds: Normal heart sounds. No murmur. No friction rub. No gallop.   Pulmonary:     Effort: Pulmonary effort is normal. No respiratory distress.     Breath sounds: Normal breath sounds. No wheezing or rales.  Abdominal:     General: Bowel sounds are normal.     Palpations: Abdomen is soft. There is no mass.     Tenderness: There is no abdominal tenderness. There is no guarding or rebound.  Musculoskeletal: Normal range of motion.        General: No tenderness.  Lymphadenopathy:     Cervical: No cervical adenopathy.  Skin:    General: Skin is warm.     Findings: No rash.  Neurological:     Mental Status: She is alert and oriented to person, place, and time.     Cranial Nerves: No cranial nerve deficit.     Deep Tendon Reflexes: Reflexes normal.  Psychiatric:        Mood and Affect: Mood is not anxious or depressed.     Wt Readings from Last 3 Encounters:  07/31/18 164 lb (74.4 kg)  02/15/18 160 lb (72.6 kg)  09/12/17 164 lb (74.4 kg)    BP 120/70   Pulse 64   Ht 5\' 6"   (1.676 m)   Wt 164 lb (74.4 kg)   LMP 07/31/2018 (Exact Date)   BMI 26.47 kg/m   Assessment and Plan:  1. Encounter for surveillance of contraceptive pills Was on intravaginal her gynecology but has not been on it since the last 7 months patient denies personally being pregnant at this time.  And would like to resume oral contra conception.  Will refill the intravaginal to be taken today which is the first day of her.  And will return to clinic or call her gynecologist for scheduling of Pap smear since is been about 3 years. - levonorgestrel-ethinyl estradiol (INTROVALE) 0.15-0.03 MG tablet; Take 1 tablet by mouth daily.  Dispense: 3 Package; Refill: 3  2. Encounter for smoking cessation counseling Patient desires to stop smoking but has not given thought of which approach to take previous attempts have been successful but patient has resumed after couple months but currently no smoking on a more regular basis and would need  some assistance patient was given information and written prescriptions for both Chantix and NicoDerm CQ and will decide 1 of the other of which take pending research on line as well is availability due to cost. - varenicline (CHANTIX STARTING MONTH PAK) 0.5 MG X 11 & 1 MG X 42 tablet; Take one 0.5 mg tablet by mouth once daily for 3 days, then increase to one 0.5 mg tablet twice daily for 4 days, then increase to one 1 mg tablet twice daily.  Dispense: 53 tablet; Refill: 0 - nicotine (NICODERM CQ - DOSED IN MG/24 HOURS) 21 mg/24hr patch; Place 1 patch (21 mg total) onto the skin daily.  Dispense: 28 patch; Refill: 0  3. Establishing care with new doctor, encounter for Patient establishes care with new physician.  Review of previous charts and encounter as well as specialty notes were reviewed.

## 2018-07-31 NOTE — Patient Instructions (Signed)

## 2018-08-14 ENCOUNTER — Encounter: Payer: Managed Care, Other (non HMO) | Admitting: Family Medicine

## 2019-01-17 ENCOUNTER — Other Ambulatory Visit: Payer: Self-pay

## 2019-01-17 DIAGNOSIS — Z716 Tobacco abuse counseling: Secondary | ICD-10-CM

## 2019-01-17 MED ORDER — NICOTINE 21 MG/24HR TD PT24: 21.0000 mg | MEDICATED_PATCH | Freq: Every day | TRANSDERMAL | 0 refills | Status: DC

## 2019-01-18 ENCOUNTER — Telehealth: Payer: Self-pay | Admitting: Emergency Medicine

## 2019-01-18 ENCOUNTER — Ambulatory Visit
Admission: EM | Admit: 2019-01-18 | Discharge: 2019-01-18 | Disposition: A | Discharge status: Home / Self Care | Payer: Managed Care, Other (non HMO) | Attending: Family Medicine | Admitting: Family Medicine

## 2019-01-18 ENCOUNTER — Other Ambulatory Visit: Payer: Self-pay

## 2019-01-18 ENCOUNTER — Encounter: Payer: Self-pay | Admitting: Emergency Medicine

## 2019-01-18 DIAGNOSIS — R0981 Nasal congestion: Secondary | ICD-10-CM | POA: Diagnosis not present

## 2019-01-18 DIAGNOSIS — B349 Viral infection, unspecified: Secondary | ICD-10-CM | POA: Insufficient documentation

## 2019-01-18 DIAGNOSIS — Z79899 Other long term (current) drug therapy: Secondary | ICD-10-CM | POA: Insufficient documentation

## 2019-01-18 DIAGNOSIS — Z7189 Other specified counseling: Secondary | ICD-10-CM | POA: Diagnosis present

## 2019-01-18 DIAGNOSIS — F17210 Nicotine dependence, cigarettes, uncomplicated: Secondary | ICD-10-CM | POA: Diagnosis not present

## 2019-01-18 DIAGNOSIS — Z793 Long term (current) use of hormonal contraceptives: Secondary | ICD-10-CM | POA: Insufficient documentation

## 2019-01-18 DIAGNOSIS — M791 Myalgia, unspecified site: Secondary | ICD-10-CM | POA: Diagnosis not present

## 2019-01-18 DIAGNOSIS — J029 Acute pharyngitis, unspecified: Secondary | ICD-10-CM | POA: Diagnosis not present

## 2019-01-18 DIAGNOSIS — F329 Major depressive disorder, single episode, unspecified: Secondary | ICD-10-CM | POA: Diagnosis not present

## 2019-01-18 DIAGNOSIS — Z20822 Contact with and (suspected) exposure to covid-19: Secondary | ICD-10-CM | POA: Insufficient documentation

## 2019-01-18 LAB — GROUP A STREP BY PCR: Group A Strep by PCR: NOT DETECTED

## 2019-01-18 NOTE — ED Provider Notes (Signed)
[facility] ____________________________________________  Time seen: Approximately 9:10 AM  I have reviewed the triage vital signs and the nursing notes.   HISTORY  Chief Complaint Sore Throat, Nasal Congestion, and Generalized Body Aches   HPI Nancy Koch is a 24 y.o. female presenting for evaluation of scratchy throat, postnasal drainage, body aches and low-grade temperature present upon awakening this morning.  States T-max 99.5.  No alleviating measures attempted prior to arrival.  Reports potential recent exposure of COVID-19 from her mother-in-law who tested positive last week.  Denies chest pain, shortness of breath, change in taste or smell, vomiting and diarrhea.  Denies pregnancy.  Denies other recent sickness.  Reports otherwise doing well.    Past Medical History:  Diagnosis Date  . Appendicitis   . UTI (urinary tract infection)     Patient Active Problem List   Diagnosis Date Noted  . Pelvic pain in female 10/25/2015  . Postpartum endometritis 10/22/2015  . Active preterm labor 10/11/2015  . Acute appendicitis 08/13/2014  . Depressive disorder 12/29/2011  . Major depressive disorder, single episode 12/29/2011    Past Surgical History:  Procedure Laterality Date  . APPENDECTOMY    . LAPAROSCOPIC APPENDECTOMY N/A 08/13/2014   Procedure: APPENDECTOMY LAPAROSCOPIC;  Surgeon: Florene Glen, MD;  Location: [facility];  Service: General;  Laterality: N/A;  . LAPAROSCOPY N/A 11/10/2014   Procedure: LAPAROSCOPY DIAGNOSTIC;  Surgeon: Boykin Nearing, MD;  Location: [facility];  Service: Gynecology;  Laterality: N/A;     No current facility-administered medications for this encounter.  Current Outpatient Medications:  .  levonorgestrel-ethinyl estradiol (INTROVALE) 0.15-0.03 MG tablet, Take 1 tablet by mouth daily., Disp: 3 Package, Rfl: 3 .  nicotine (NICODERM CQ - DOSED IN MG/24 HOURS) 21 mg/24hr patch, Place 1 patch (21 mg total) onto the skin  daily., Disp: 28 patch, Rfl: 0 .  chlorhexidine (PERIDEX) 0.12 % solution, RINSE MOUTH WITH 15 ML FOR 30 SECONDS IN THE MORNING AND NIGHT AFTER BRUSHING TEETH. SPIT OUT. DO NOT SWALLOW, Disp: , Rfl:  .  HYDROcodone-acetaminophen (NORCO/VICODIN) 5-325 MG tablet, TK 1 T PO Q 4 TO 6 H PRN P, Disp: , Rfl:  .  ibuprofen (ADVIL) 400 MG tablet, TK 1 T PO TID PRN, Disp: , Rfl:  .  varenicline (CHANTIX STARTING MONTH PAK) 0.5 MG X 11 & 1 MG X 42 tablet, Take one 0.5 mg tablet by mouth once daily for 3 days, then increase to one 0.5 mg tablet twice daily for 4 days, then increase to one 1 mg tablet twice daily., Disp: 53 tablet, Rfl: 0  Allergies Patient has no known allergies.  Family History  Problem Relation Age of Onset  . Cancer Mother        cervical  . Diabetes Father   . Diabetes Sister   . Cancer Maternal Grandmother   . Depression Paternal Grandmother   . Diabetes Paternal Grandfather     Social History Social History   Tobacco Use  . Smoking status: Current Every Day Smoker    Packs/day: 1.00    Types: Cigarettes  . Smokeless tobacco: Never Used  Substance Use Topics  . Alcohol use: Yes    Comment: social  . Drug use: No    Review of Systems Constitutional: As above.  ENT: As above. Cardiovascular: Denies chest pain. Respiratory: Denies shortness of breath. Gastrointestinal: No abdominal pain.  No nausea, no vomiting.  No diarrhea.   Musculoskeletal: Negative for back pain. Skin: Negative for  rash. Neurological: Negative for focal weakness.   ____________________________________________   PHYSICAL EXAM:  VITAL SIGNS: ED Triage Vitals  Enc Vitals Group     BP 01/18/19 0834 132/76     Pulse Rate 01/18/19 0834 91     Resp 01/18/19 0834 14     Temp 01/18/19 0834 98.1 F (36.7 C)     Temp Source 01/18/19 0834 Oral     SpO2 01/18/19 0834 99 %     Weight 01/18/19 0830 164 lb (74.4 kg)     Height 01/18/19 0830 5\' 6"  (1.676 m)     Head Circumference --      Peak  Flow --      Pain Score 01/18/19 0830 4     Pain Loc --      Pain Edu? --      Excl. in GC? --     Constitutional: Alert and oriented. Well appearing and in no acute distress. Eyes: Conjunctivae are normal.  Head: Atraumatic.  No swelling. No erythema.  Nose:Nasal congestion   Mouth/Throat: Mucous membranes are moist. No pharyngeal erythema. No tonsillar swelling or exudate.  Neck: No stridor.  No cervical spine tenderness to palpation. Hematological/Lymphatic/Immunilogical: No cervical lymphadenopathy. Cardiovascular: Normal rate, regular rhythm. Grossly normal heart sounds.  Good peripheral circulation. Respiratory: Normal respiratory effort.  No retractions. No wheezes, rales or rhonchi. Good air movement.  Musculoskeletal: Ambulatory with steady gait. Neurologic:  Normal speech and language. No gait instability. Skin:  Skin appears warm, dry and intact. No rash noted. Psychiatric: Mood and affect are normal. Speech and behavior are normal.  ___________________________________________   LABS (all labs ordered are listed, but only abnormal results are displayed)  Labs Reviewed  GROUP A STREP BY PCR  NOVEL CORONAVIRUS, NAA (HOSP ORDER, SEND-OUT TO REF LAB; TAT 18-24 HRS)     PROCEDURES Procedures     INITIAL IMPRESSION / ASSESSMENT AND PLAN / ED COURSE  Pertinent labs & imaging results that were available during my care of the patient were reviewed by me and considered in my medical decision making (see chart for details).  Well-appearing patient.  No acute distress.  Strep negative, will culture.  COVID-19 testing completed and advice given.  Suspect viral illness.  Encourage rest, fluids, supportive care, over-the-counter medications as needed.  Work note given.  Discussed follow up and return parameters including no resolution or any worsening concerns. Patient verbalized understanding and agreed to plan.   ____________________________________________   FINAL  CLINICAL IMPRESSION(S) / ED DIAGNOSES  Final diagnoses:  Viral illness  Advice given about COVID-19 virus infection     ED Discharge Orders    None       Note: This dictation was prepared with Dragon dictation along with smaller phrase technology. Any transcriptional errors that result from this process are unintentional.         03/18/19, NP 01/18/19 1031

## 2019-01-18 NOTE — ED Triage Notes (Signed)
Patient c/o cough, sore throat, runny nose, and bodyaches that started when she woke up this morning.  Patient states her temperature at home was 99.5

## 2019-01-18 NOTE — Telephone Encounter (Signed)
Patient called wanting her Strep test result.  Patient was notified that her result for the Strep test was Negative and that the COVID test would take about 2 days.  Patient states that she has a fever of 101.  Recommended that patient take Tylenol or Ibuprofen for her fever and to keep drinking plenty of fluids and if her symptoms worsen to follow-up here or at ED. Patient verbalized understanding.

## 2019-01-18 NOTE — Discharge Instructions (Addendum)
Rest. Drink plenty of fluids. Over the counter medication as needed.   Follow up with your primary care physician this week as needed. Return to Urgent care for new or worsening concerns.

## 2019-01-19 ENCOUNTER — Ambulatory Visit
Admission: EM | Admit: 2019-01-19 | Discharge: 2019-01-19 | Disposition: A | Discharge status: Home / Self Care | Payer: Managed Care, Other (non HMO) | Attending: Family Medicine | Admitting: Family Medicine

## 2019-01-19 ENCOUNTER — Other Ambulatory Visit: Payer: Self-pay

## 2019-01-19 DIAGNOSIS — R0981 Nasal congestion: Secondary | ICD-10-CM | POA: Diagnosis not present

## 2019-01-19 DIAGNOSIS — R05 Cough: Secondary | ICD-10-CM | POA: Diagnosis not present

## 2019-01-19 DIAGNOSIS — B349 Viral infection, unspecified: Secondary | ICD-10-CM | POA: Diagnosis not present

## 2019-01-19 DIAGNOSIS — Z7189 Other specified counseling: Secondary | ICD-10-CM | POA: Diagnosis present

## 2019-01-19 DIAGNOSIS — Z20822 Contact with and (suspected) exposure to covid-19: Secondary | ICD-10-CM | POA: Diagnosis present

## 2019-01-19 DIAGNOSIS — R509 Fever, unspecified: Secondary | ICD-10-CM

## 2019-01-19 LAB — NOVEL CORONAVIRUS, NAA (HOSP ORDER, SEND-OUT TO REF LAB; TAT 18-24 HRS): SARS-CoV-2, NAA: NOT DETECTED

## 2019-01-19 LAB — INFLUENZA PANEL BY PCR (TYPE A & B)
Influenza A By PCR: NEGATIVE
Influenza B By PCR: NEGATIVE

## 2019-01-19 MED ORDER — BENZONATATE 100 MG PO CAPS: 100.0000 mg | ORAL_CAPSULE | Freq: Three times a day (TID) | ORAL | 0 refills | Status: DC | PRN

## 2019-01-19 NOTE — ED Provider Notes (Signed)
[facility] ____________________________________________  Time seen: Approximately 9:19 AM  I have reviewed the triage vital signs and the nursing notes.   HISTORY  Chief Complaint Cough  HPI Nancy Koch is a 24 y.o. female present for evaluation of cough, congestion and fever.  Patient was seen in urgent care for the same complaints yesterday at that time she was having nasal congestion and scratchy throat.  Reports as the day progressed yesterday she was developing more of a cough and then developed a fever.  States fever in the afternoon was 101 and then T-max last night was 102.8.  Has been taken intermittent DayQuil and NyQuil as well as Claritin-D which helps.  States has had some body aches.  Patient has had potential COVID-19 exposure from her mother-in-law.  Continues to drink fluids well.  Denies vomiting or diarrhea.  States cough is occasionally productive.  Denies others in household sick.  Denies other aggravating or alleviating factors.  Juline Patch, MD : PCP  No LMP recorded. (Menstrual status: Oral contraceptives).    Past Medical History:  Diagnosis Date  . Appendicitis   . UTI (urinary tract infection)     Patient Active Problem List   Diagnosis Date Noted  . Pelvic pain in female 10/25/2015  . Postpartum endometritis 10/22/2015  . Active preterm labor 10/11/2015  . Acute appendicitis 08/13/2014  . Depressive disorder 12/29/2011  . Major depressive disorder, single episode 12/29/2011    Past Surgical History:  Procedure Laterality Date  . APPENDECTOMY    . LAPAROSCOPIC APPENDECTOMY N/A 08/13/2014   Procedure: APPENDECTOMY LAPAROSCOPIC;  Surgeon: Florene Glen, MD;  Location: [facility];  Service: General;  Laterality: N/A;  . LAPAROSCOPY N/A 11/10/2014   Procedure: LAPAROSCOPY DIAGNOSTIC;  Surgeon: Boykin Nearing, MD;  Location: [facility];  Service: Gynecology;  Laterality: N/A;     No current facility-administered medications  for this encounter.  Current Outpatient Medications:  .  benzonatate (TESSALON PERLES) 100 MG capsule, Take 1 capsule (100 mg total) by mouth 3 (three) times daily as needed for cough., Disp: 20 capsule, Rfl: 0 .  ibuprofen (ADVIL) 400 MG tablet, TK 1 T PO TID PRN, Disp: , Rfl:  .  levonorgestrel-ethinyl estradiol (INTROVALE) 0.15-0.03 MG tablet, Take 1 tablet by mouth daily., Disp: 3 Package, Rfl: 3 .  nicotine (NICODERM CQ - DOSED IN MG/24 HOURS) 21 mg/24hr patch, Place 1 patch (21 mg total) onto the skin daily., Disp: 28 patch, Rfl: 0  Allergies Patient has no known allergies.  Family History  Problem Relation Age of Onset  . Cancer Mother        cervical  . Diabetes Father   . Diabetes Sister   . Cancer Maternal Grandmother   . Depression Paternal Grandmother   . Diabetes Paternal Grandfather     Social History Social History   Tobacco Use  . Smoking status: Current Every Day Smoker    Packs/day: 1.00    Types: Cigarettes  . Smokeless tobacco: Never Used  Substance Use Topics  . Alcohol use: Yes    Comment: social  . Drug use: No    Review of Systems Constitutional: Positive fever.  ENT: As above.  Cardiovascular: Denies chest pain. Respiratory: Denies shortness of breath. Gastrointestinal: No abdominal pain.  No nausea, no vomiting.  No diarrhea.  Genitourinary: Negative for dysuria. Musculoskeletal: Positive body aches.  Skin: Negative for rash.   ____________________________________________   PHYSICAL EXAM:  VITAL SIGNS: ED Triage Vitals  Enc  Vitals Group     BP 01/19/19 0854 136/88     Pulse Rate 01/19/19 0854 84     Resp 01/19/19 0854 16     Temp 01/19/19 0854 98.8 F (37.1 C)     Temp Source 01/19/19 0854 Oral     SpO2 01/19/19 0854 99 %     Weight 01/19/19 0855 163 lb 12.8 oz (74.3 kg)     Height 01/19/19 0855 5\' 6"  (1.676 m)     Head Circumference --      Peak Flow --      Pain Score 01/19/19 0855 6     Pain Loc --      Pain Edu? --       Excl. in GC? --     Constitutional: Alert and oriented. Eyes: Conjunctivae are normal.  Head: Atraumatic. No sinus tenderness to palpation. No swelling. No erythema.  Ears: no erythema, normal TMs bilaterally.   Nose:Nasal congestion with clear rhinorrhea  Mouth/Throat: Mucous membranes are moist.  Neck: No stridor.  No cervical spine tenderness to palpation. Hematological/Lymphatic/Immunilogical: No cervical lymphadenopathy. Cardiovascular: Normal rate, regular rhythm. Grossly normal heart sounds.  Good peripheral circulation. Respiratory: Normal respiratory effort.  No retractions. No wheezes, rales or rhonchi. Good air movement.  Musculoskeletal: Ambulatory with steady gait.  Neurologic:  Normal speech and language. No gait instability. Skin:  Skin appears warm, dry and intact. No rash noted. Psychiatric: Mood and affect are normal. Speech and behavior are normal.  ___________________________________________   LABS (all labs ordered are listed, but only abnormal results are displayed)  Labs Reviewed  INFLUENZA PANEL BY PCR (TYPE A & B)    PROCEDURES Procedures    INITIAL IMPRESSION / ASSESSMENT AND PLAN / ED COURSE  Pertinent labs & imaging results that were available during my care of the patient were reviewed by me and considered in my medical decision making (see chart for details).  Overall well-appearing patient.  Seen yesterday for same complaints.  Strep yesterday negative.  COVID-19 test still pending.  Influenza test today negative, (patient left prior to these results, and patient called and informed of negative flu results.)  Suspect viral illness, concern for COVID-19.  As needed 03/19/19.  Continue Claritin-D and supportive care.  Rest and fluids.  Patient was given work note yesterday.  Discussed indication, risks and benefits of medications with patient. Discussed follow up and return parameters including no resolution or any worsening concerns. Patient  verbalized understanding and agreed to plan.   ____________________________________________   FINAL CLINICAL IMPRESSION(S) / ED DIAGNOSES  Final diagnoses:  Viral illness  Suspected COVID-19 virus infection  Advice given about COVID-19 virus infection     ED Discharge Orders         Ordered    benzonatate (TESSALON PERLES) 100 MG capsule  3 times daily PRN     01/19/19 0926           Note: This dictation was prepared with Dragon dictation along with smaller phrase technology. Any transcriptional errors that result from this process are unintentional.         03/19/19, NP 01/19/19 1147

## 2019-01-19 NOTE — Discharge Instructions (Signed)
Take medication as prescribed. Continue over the counter claritin-d. Rest. Drink plenty of fluids.   Follow up with your primary care physician this week as needed. Return to Urgent care or ER for new or worsening concerns.

## 2019-01-19 NOTE — ED Triage Notes (Signed)
Pt reports "chest infection" , cough, and drainage down back of throat. Productive cough. Had COVID test yesterday which isn't back yet. States she thinks she needs ABX for either URI or sinus infection.

## 2019-05-09 ENCOUNTER — Encounter: Payer: Self-pay | Admitting: Podiatry

## 2019-05-09 ENCOUNTER — Ambulatory Visit: Payer: Managed Care, Other (non HMO) | Admitting: Podiatry

## 2019-05-09 ENCOUNTER — Other Ambulatory Visit: Payer: Self-pay

## 2019-05-09 VITALS — Temp 97.9°F

## 2019-05-09 DIAGNOSIS — M79674 Pain in right toe(s): Secondary | ICD-10-CM | POA: Diagnosis not present

## 2019-05-09 DIAGNOSIS — L603 Nail dystrophy: Secondary | ICD-10-CM

## 2019-05-09 DIAGNOSIS — L600 Ingrowing nail: Secondary | ICD-10-CM | POA: Diagnosis not present

## 2019-05-10 ENCOUNTER — Encounter: Payer: Self-pay | Admitting: Podiatry

## 2019-05-10 NOTE — Progress Notes (Signed)
  Subjective:  Patient ID: Nancy Koch, female    DOB: Jul 03, 1995,  MRN: [id number]  Chief Complaint  Patient presents with  . Ingrown Toenail    Patient presents today for ingrown toenail left hallux x 4 days    24 y.o. female presents with the above complaint.  Patient presents with left medial border ingrown that has been going on for quite some time.  She states that there is soreness at the base of the toe has been draining constantly.  She has had previous temporary nail avulsion that has grown back and is causing her a lot of pain.  Patient has tried peroxide but has not helped much.  The pain started 4 days ago has worsened with time.  She denies any other acute complaints.  She would like to have this removed if possible.   Review of Systems: Negative except as noted in the HPI. Denies N/V/F/Ch.  Past Medical History:  Diagnosis Date  . Appendicitis   . UTI (urinary tract infection)     Current Outpatient Medications:  .  ibuprofen (ADVIL) 400 MG tablet, TK 1 T PO TID PRN, Disp: , Rfl:  .  levonorgestrel-ethinyl estradiol (INTROVALE) 0.15-0.03 MG tablet, Take 1 tablet by mouth daily., Disp: 3 Package, Rfl: 3  Social History   Tobacco Use  Smoking Status Current Every Day Smoker  . Packs/day: 1.00  . Types: Cigarettes  Smokeless Tobacco Never Used    No Known Allergies Objective:   Vitals:   05/09/19 1528  Temp: 97.9 F (36.6 C)   There is no height or weight on file to calculate BMI. Constitutional Well developed. Well nourished.  Vascular Dorsalis pedis pulses palpable bilaterally. Posterior tibial pulses palpable bilaterally. Capillary refill normal to all digits.  No cyanosis or clubbing noted. Pedal hair growth normal.  Neurologic Normal speech. Oriented to person, place, and time. Epicritic sensation to light touch grossly present bilaterally.  Dermatologic Painful ingrowing nail at medial nail borders of the hallux nail right. No other open  wounds. No skin lesions.  Orthopedic: Normal joint ROM without pain or crepitus bilaterally. No visible deformities. No bony tenderness.   Radiographs: None Assessment:   1. Ingrown toenail of right foot   2. Great toe pain, right   3. Nail dystrophy    Plan:  Patient was evaluated and treated and all questions answered.  Ingrown Nail, right/nail dystrophy -Patient elects to proceed with minor surgery to remove ingrown toenail removal today. Consent reviewed and signed by patient. -Ingrown nail excised. See procedure note. -Educated on post-procedure care including soaking. Written instructions provided and reviewed. -Patient to follow up in 2 weeks for nail check.  Procedure: Excision of Ingrown Toenail Location: Right 1st toe medial nail borders. Anesthesia: Lidocaine 1% plain; 1.5 mL and Marcaine 0.5% plain; 1.5 mL, digital block. Skin Prep: Betadine. Dressing: Silvadene; telfa; dry, sterile, compression dressing. Technique: Following skin prep, the toe was exsanguinated and a tourniquet was secured at the base of the toe. The affected nail border was freed, split with a nail splitter, and excised. Chemical matrixectomy was then performed with phenol and irrigated out with alcohol. The tourniquet was then removed and sterile dressing applied. Disposition: Patient tolerated procedure well. Patient to return in 2 weeks for follow-up.   No follow-ups on file.

## 2019-07-01 ENCOUNTER — Telehealth: Payer: Self-pay | Admitting: Family Medicine

## 2019-07-01 NOTE — Telephone Encounter (Signed)
Called and left a message, cannot do morning cpe.

## 2019-07-01 NOTE — Telephone Encounter (Unsigned)
Copied from CRM [id number]. Topic: Appointment Scheduling - Scheduling Inquiry for Clinic >> Jul 01, 2019  9:18 AM Nancy Koch wrote: Patient calling to inquire if Dr. Yetta Barre would be willing to do CPE on a Friday afternoon due to patient's work schedule. Patient states she is unable to come in the morning at approved CPE times and can only take off on a Friday afternoon. Please advise.

## 2019-07-01 NOTE — Telephone Encounter (Signed)
Please call pt and tell her we only do morning physicals due to labs and time slots

## 2019-07-05 ENCOUNTER — Encounter: Payer: Managed Care, Other (non HMO) | Admitting: Family Medicine

## 2019-08-18 ENCOUNTER — Ambulatory Visit: Payer: Self-pay

## 2021-04-20 ENCOUNTER — Ambulatory Visit
Admission: EM | Admit: 2021-04-20 | Discharge: 2021-04-20 | Disposition: A | Discharge status: Home / Self Care | Payer: Managed Care, Other (non HMO) | Attending: Physician Assistant | Admitting: Physician Assistant

## 2021-04-20 ENCOUNTER — Encounter: Payer: Self-pay | Admitting: Emergency Medicine

## 2021-04-20 ENCOUNTER — Other Ambulatory Visit: Payer: Self-pay

## 2021-04-20 DIAGNOSIS — R310 Gross hematuria: Secondary | ICD-10-CM | POA: Insufficient documentation

## 2021-04-20 DIAGNOSIS — N390 Urinary tract infection, site not specified: Secondary | ICD-10-CM | POA: Insufficient documentation

## 2021-04-20 LAB — URINALYSIS, ROUTINE W REFLEX MICROSCOPIC

## 2021-04-20 LAB — URINALYSIS, MICROSCOPIC (REFLEX)
RBC / HPF: >50 RBC/hpf (ref 0–5)
WBC, UA: >50 WBC/hpf (ref 0–5)

## 2021-04-20 LAB — PREGNANCY, URINE: Preg Test, Ur: NEGATIVE

## 2021-04-20 MED ORDER — CEFTRIAXONE SODIUM 1 G IJ SOLR
1.0000 g | Freq: Once | INTRAMUSCULAR | Status: AC
  Administered 2021-04-20: 1 g via INTRAMUSCULAR

## 2021-04-20 MED ORDER — PHENAZOPYRIDINE HCL 200 MG PO TABS: 200.0000 mg | ORAL_TABLET | Freq: Three times a day (TID) | ORAL | 0 refills | Status: DC

## 2021-04-20 MED ORDER — CEFPODOXIME PROXETIL 200 MG PO TABS: 200.0000 mg | ORAL_TABLET | Freq: Two times a day (BID) | ORAL | 0 refills | Status: AC

## 2021-04-20 NOTE — ED Triage Notes (Signed)
Pt c/o dysuria, urinary frequency, hematuria. Started today. Denies lower back pain, fever. Denies vaginal discharge.  ?

## 2021-04-20 NOTE — ED Provider Notes (Signed)
[facility]    CSN: [id number] Arrival date & time: 04/20/21  1745      History   Chief Complaint Chief Complaint  Patient presents with   Dysuria   Hematuria    HPI Nancy Koch is a 26 y.o. female.   26 year old female, Nancy Koch, presents to urgent care with chief complaint of dysuria, urinary frequency, hematuria that started today.  Patient denies low back pain fever or vaginal discharge.  The history is provided by the patient. No language interpreter was used.   Past Medical History:  Diagnosis Date   Appendicitis    UTI (urinary tract infection)     Patient Active Problem List   Diagnosis Date Noted   Acute UTI 04/20/2021   Gross hematuria 04/20/2021   Pelvic pain in female 10/25/2015   Postpartum endometritis 10/22/2015   Active preterm labor 10/11/2015   Acute appendicitis 08/13/2014   Depressive disorder 12/29/2011   Major depressive disorder, single episode 12/29/2011    Past Surgical History:  Procedure Laterality Date   APPENDECTOMY     LAPAROSCOPIC APPENDECTOMY N/A 08/13/2014   Procedure: APPENDECTOMY LAPAROSCOPIC;  Surgeon: Lattie Haw, MD;  Location: [facility] OR[facility];  Service: General;  Laterality: N/A;   LAPAROSCOPY N/A 11/10/2014   Procedure: LAPAROSCOPY DIAGNOSTIC;  Surgeon: Suzy Bouchard, MD;  Location: [facility];  Service: Gynecology;  Laterality: N/A;    OB History     Gravida  1   Para  1   Term  1   Preterm      AB      Living  1      SAB      IAB      Ectopic      Multiple  0   Live Births  1            Home Medications    Prior to Admission medications   Medication Sig Start Date End Date Taking? Authorizing Provider  cefpodoxime (VANTIN) 200 MG tablet Take 1 tablet (200 mg total) by mouth 2 (two) times daily for 10 days. 04/20/21 04/30/21 Yes Keijuan Schellhase, Para March, NP  phenazopyridine (PYRIDIUM) 200 MG tablet Take 1 tablet (200 mg total) by mouth 3 (three) times daily. 04/20/21  Yes  Noris Kulinski, Para March, NP  ibuprofen (ADVIL) 400 MG tablet TK 1 T PO TID PRN 07/27/18   [provider]  levonorgestrel-ethinyl estradiol (INTROVALE) 0.15-0.03 MG tablet Take 1 tablet by mouth daily. 07/31/18   Duanne Limerick, MD    Family History Family History  Problem Relation Age of Onset   Cancer Mother        cervical   Diabetes Father    Diabetes Sister    Cancer Maternal Grandmother    Depression Paternal Grandmother    Diabetes Paternal Grandfather     Social History Social History   Tobacco Use   Smoking status: Every Day    Packs/day: 1.00    Types: Cigarettes   Smokeless tobacco: Never  Vaping Use   Vaping Use: Every day  Substance Use Topics   Alcohol use: Yes    Comment: social   Drug use: No     Allergies   Patient has no known allergies.   Review of Systems Review of Systems  Constitutional:  Negative for fever.  Genitourinary:  Positive for dysuria and frequency. Negative for vaginal discharge.  Musculoskeletal:  Negative for back pain.  All other systems reviewed and are negative.   Physical Exam  Triage Vital Signs ED Triage Vitals  Enc Vitals Group     BP 04/20/21 1801 128/79     Pulse Rate 04/20/21 1801 64     Resp 04/20/21 1801 16     Temp 04/20/21 1801 98.3 F (36.8 C)     Temp Source 04/20/21 1801 Oral     SpO2 04/20/21 1801 100 %     Weight 04/20/21 1759 163 lb 12.8 oz (74.3 kg)     Height 04/20/21 1759 5\' 6"  (1.676 m)     Head Circumference --      Peak Flow --      Pain Score 04/20/21 1758 6     Pain Loc --      Pain Edu? --      Excl. in GC? --    No data found.  Updated Vital Signs BP 128/79 (BP Location: Right Arm)   Pulse 64   Temp 98.3 F (36.8 C) (Oral)   Resp 16   Ht 5\' 6"  (1.676 m)   Wt 163 lb 12.8 oz (74.3 kg)   LMP 04/07/2021 (Approximate)   SpO2 100%   BMI 26.44 kg/m   Visual Acuity Right Eye Distance:   Left Eye Distance:   Bilateral Distance:    Right Eye Near:   Left Eye Near:     Bilateral Near:     Physical Exam Vitals and nursing note reviewed.  Constitutional:      General: She is not in acute distress.    Appearance: She is well-developed and well-groomed.  HENT:     Head: Normocephalic and atraumatic.     Mouth/Throat:     Lips: Pink.     Mouth: Mucous membranes are moist.     Pharynx: Oropharynx is clear.  Eyes:     General: Lids are normal. Lids are everted, no foreign bodies appreciated.     Extraocular Movements: Extraocular movements intact.     Conjunctiva/sclera: Conjunctivae normal.     Pupils: Pupils are equal, round, and reactive to light.  Neck:     Trachea: Trachea normal.  Cardiovascular:     Rate and Rhythm: Normal rate and regular rhythm.     Pulses: Normal pulses.     Heart sounds: Normal heart sounds. No murmur heard. Pulmonary:     Effort: Pulmonary effort is normal. No respiratory distress.     Breath sounds: Normal breath sounds and air entry.  Abdominal:     Palpations: Abdomen is soft.     Tenderness: There is no abdominal tenderness.  Musculoskeletal:        General: No swelling.     Cervical back: Normal range of motion and neck supple.  Skin:    General: Skin is warm and dry.     Capillary Refill: Capillary refill takes less than 2 seconds.  Neurological:     General: No focal deficit present.     Mental Status: She is alert and oriented to person, place, and time.     GCS: GCS eye subscore is 4. GCS verbal subscore is 5. GCS motor subscore is 6.  Psychiatric:        Attention and Perception: Attention normal.        Mood and Affect: Mood normal.        Speech: Speech normal.        Behavior: Behavior normal. Behavior is cooperative.     UC Treatments / Results  Labs (all labs ordered are listed, but only abnormal  results are displayed) Labs Reviewed  URINALYSIS, ROUTINE W REFLEX MICROSCOPIC - Abnormal; Notable for the following components:      Result Value   Color, Urine RED (*)    APPearance CLOUDY (*)     Glucose, UA   (*)    Value: TEST NOT REPORTED DUE TO COLOR INTERFERENCE OF URINE PIGMENT   Hgb urine dipstick   (*)    Value: TEST NOT REPORTED DUE TO COLOR INTERFERENCE OF URINE PIGMENT   Bilirubin Urine   (*)    Value: TEST NOT REPORTED DUE TO COLOR INTERFERENCE OF URINE PIGMENT   Ketones, ur   (*)    Value: TEST NOT REPORTED DUE TO COLOR INTERFERENCE OF URINE PIGMENT   Protein, ur   (*)    Value: TEST NOT REPORTED DUE TO COLOR INTERFERENCE OF URINE PIGMENT   Nitrite   (*)    Value: TEST NOT REPORTED DUE TO COLOR INTERFERENCE OF URINE PIGMENT   Leukocytes,Ua   (*)    Value: TEST NOT REPORTED DUE TO COLOR INTERFERENCE OF URINE PIGMENT   All other components within normal limits  URINALYSIS, MICROSCOPIC (REFLEX) - Abnormal; Notable for the following components:   Bacteria, UA MANY (*)    All other components within normal limits  URINE CULTURE  PREGNANCY, URINE    EKG   Radiology No results found.  Procedures Procedures (including critical care time)  Medications Ordered in UC Medications  cefTRIAXone (ROCEPHIN) injection 1 g (1 g Intramuscular Given 04/20/21 1832)    Initial Impression / Assessment and Plan / UC Course  I have reviewed the triage vital signs and the nursing notes.  Pertinent labs & imaging results that were available during my care of the patient were reviewed by me and considered in my medical decision making (see chart for details).  Clinical Course as of 04/20/21 1855  Tue Apr 20, 2021  1828 Rocephin 1 g IM for UTI discussed plan of care with patient will prescription remainder of antibiotic to local pharmacy along with Pyridium if patient develops any nausea vomiting fever back pain muscle aches Ms. go to the emergency room for further lab work and imaging.  Patient verbalized understanding this provider [JD]    Clinical Course User Index [JD] Velma Agnes, Para March, NP    Ddx: Acute uti w hematuria, kidney stones, pyleonephritis Final Clinical  Impressions(s) / UC Diagnoses   Final diagnoses:  Acute UTI  Gross hematuria     Discharge Instructions      Rest push water/fluids.  Avoid caffeine.  Take antibiotic as directed.  Go immediately to ER if you develop nausea, vomiting, flank pain, muscle aches, or fever.  The Pyridium will turn your urine orange.     ED Prescriptions     Medication Sig Dispense Auth. Provider   cefpodoxime (VANTIN) 200 MG tablet Take 1 tablet (200 mg total) by mouth 2 (two) times daily for 10 days. 20 tablet Laiken Sandy, NP   phenazopyridine (PYRIDIUM) 200 MG tablet Take 1 tablet (200 mg total) by mouth 3 (three) times daily. 6 tablet Raynah Gomes, Para March, NP      PDMP not reviewed this encounter.   Clancy Gourd, NP 04/20/21 [phone]

## 2021-04-20 NOTE — Discharge Instructions (Signed)
Rest push water/fluids.  Avoid caffeine.  Take antibiotic as directed.  Go immediately to ER if you develop nausea, vomiting, flank pain, muscle aches, or fever.  The Pyridium will turn your urine orange. ?

## 2021-04-22 ENCOUNTER — Other Ambulatory Visit: Payer: Self-pay

## 2021-04-22 ENCOUNTER — Emergency Department
Admission: EM | Admit: 2021-04-22 | Discharge: 2021-04-22 | Disposition: A | Discharge status: Home / Self Care | Payer: Managed Care, Other (non HMO) | Attending: Emergency Medicine | Admitting: Emergency Medicine

## 2021-04-22 ENCOUNTER — Encounter: Payer: Self-pay | Admitting: Emergency Medicine

## 2021-04-22 DIAGNOSIS — N3001 Acute cystitis with hematuria: Secondary | ICD-10-CM | POA: Diagnosis not present

## 2021-04-22 DIAGNOSIS — R300 Dysuria: Secondary | ICD-10-CM | POA: Diagnosis present

## 2021-04-22 LAB — CBC
HCT: 38.8 % (ref 36.0–46.0)
Hemoglobin: 12.8 g/dL (ref 12.0–15.0)
MCH: 29 pg (ref 26.0–34.0)
MCHC: 33 g/dL (ref 30.0–36.0)
MCV: 88 fL (ref 80.0–100.0)
Platelets: 295 10*3/uL (ref 150–400)
RBC: 4.41 MIL/uL (ref 3.87–5.11)
RDW: 12.3 % (ref 11.5–15.5)
WBC: 9 10*3/uL (ref 4.0–10.5)
nRBC: 0 % (ref 0.0–0.2)

## 2021-04-22 LAB — URINE CULTURE: Culture: <10000 — AB

## 2021-04-22 LAB — URINALYSIS, ROUTINE W REFLEX MICROSCOPIC
Bilirubin Urine: NEGATIVE
Glucose, UA: NEGATIVE mg/dL
Hgb urine dipstick: NEGATIVE
Ketones, ur: NEGATIVE mg/dL
Leukocytes,Ua: NEGATIVE
Nitrite: POSITIVE — AB
Protein, ur: NEGATIVE mg/dL
Specific Gravity, Urine: 1.012 (ref 1.005–1.030)
pH: 5 (ref 5.0–8.0)

## 2021-04-22 LAB — BASIC METABOLIC PANEL
Anion gap: 6 (ref 5–15)
BUN: 8 mg/dL (ref 6–20)
CO2: 23 mmol/L (ref 22–32)
Calcium: 9.4 mg/dL (ref 8.9–10.3)
Chloride: 107 mmol/L (ref 98–111)
Creatinine, Ser: 0.68 mg/dL (ref 0.44–1.00)
GFR, Estimated: >60 mL/min (ref >60)
Glucose, Bld: 97 mg/dL (ref 70–99)
Potassium: 3.7 mmol/L (ref 3.5–5.1)
Sodium: 136 mmol/L (ref 135–145)

## 2021-04-22 LAB — POC URINE PREG, ED: Preg Test, Ur: NEGATIVE

## 2021-04-22 LAB — PREGNANCY, URINE: Preg Test, Ur: NEGATIVE

## 2021-04-22 NOTE — ED Provider Notes (Signed)
? ?[facility] ?Provider Note ? ? ? Event Date/Time  ? First MD Initiated Contact with Patient 04/22/21 1055   ?  (approximate) ? ? ?History  ? ?Chief Complaint ?Dysuria ? ? ?HPI ? ?Nancy Koch is a 26 y.o. female with no significant past medical history who presents to the ED complaining of dysuria.  Patient reports that she has had 3 days of burning when she urinates along with blood in her urine.  She denies any associated abdominal pain, flank pain, nausea, vomiting, or fever.  She was seen at urgent care 2 days ago and diagnosed with UTI, given a shot of Rocephin and started on cefpodoxime.  She was then told that her culture showed no growth and that she should come to the ED for evaluation.  She states that overall her symptoms are improving and hematuria has resolved, although dysuria remains present.  She denies any vaginal bleeding or discharge, states her LMP was about a week and a half ago. ?  ? ? ?Physical Exam  ? ?Triage Vital Signs: ?ED Triage Vitals  ?Enc Vitals Group  ?   BP 04/22/21 1053 134/87  ?   Pulse Rate 04/22/21 1053 87  ?   Resp 04/22/21 1053 16  ?   Temp 04/22/21 1053 98.1 ?F (36.7 ?C)  ?   Temp Source 04/22/21 1053 Oral  ?   SpO2 04/22/21 1053 95 %  ?   Weight 04/22/21 1053 163 lb 12.8 oz (74.3 kg)  ?   Height 04/22/21 1053 5\' 6"  (1.676 m)  ?   Head Circumference --   ?   Peak Flow --   ?   Pain Score 04/22/21 1053 4  ?   Pain Loc --   ?   Pain Edu? --   ?   Excl. in GC? --   ? ? ?Most recent vital signs: ?Vitals:  ? 04/22/21 1053  ?BP: 134/87  ?Pulse: 87  ?Resp: 16  ?Temp: 98.1 ?F (36.7 ?C)  ?SpO2: 95%  ? ? ?Constitutional: Alert and oriented. ?Eyes: Conjunctivae are normal. ?Head: Atraumatic. ?Nose: No congestion/rhinnorhea. ?Mouth/Throat: Mucous membranes are moist.  ?Cardiovascular: Normal rate, regular rhythm. Grossly normal heart sounds.  2+ radial pulses bilaterally. ?Respiratory: Normal respiratory effort.  No retractions. Lungs CTAB. ?Gastrointestinal:  Soft and nontender.  No CVA tenderness bilaterally.  No distention. ?Musculoskeletal: No lower extremity tenderness nor edema.  ?Neurologic:  Normal speech and language. No gross focal neurologic deficits are appreciated. ? ? ? ?ED Results / Procedures / Treatments  ? ?Labs ?(all labs ordered are listed, but only abnormal results are displayed) ?Labs Reviewed  ?URINALYSIS, ROUTINE W REFLEX MICROSCOPIC - Abnormal; Notable for the following components:  ?    Result Value  ? Color, Urine AMBER (*)   ? APPearance CLEAR (*)   ? Nitrite POSITIVE (*)   ? Bacteria, UA RARE (*)   ? All other components within normal limits  ?URINE CULTURE  ?BASIC METABOLIC PANEL  ?CBC  ?PREGNANCY, URINE  ?POC URINE PREG, ED  ? ? ?PROCEDURES: ? ?Critical Care performed: No ? ?Procedures ? ? ?MEDICATIONS ORDERED IN ED: ?Medications - No data to display ? ? ?IMPRESSION / MDM / ASSESSMENT AND PLAN / ED COURSE  ?I reviewed the triage vital signs and the nursing notes. ?             ?               ? ?  26 y.o. female with no significant past medical history who presents to the ED complaining of 3 days of dysuria, initially with some hematuria that has since resolved. ? ?Differential diagnosis includes, but is not limited to, cystitis, pyelonephritis, pregnancy, kidney stone. ? ?Patient nontoxic-appearing and in no acute distress, vital signs are unremarkable and she has a benign abdominal exam with no CVA tenderness.  Although recent urine culture did not show any growth, symptoms seem very consistent with acute cystitis, we will recheck pregnancy testing and UA here in the ED.  We will also screen CBC and BMP for any AKI or electrolyte abnormality.  No symptoms to suggest pyelonephritis or kidney stone at this time. ? ?Pregnancy testing is negative and labs are reassuring, CBC without anemia or leukocytosis, BMP without electrolyte abnormality or AKI.  UA is improved from previous and clinically symptoms sound consistent with ongoing but improving  UTI.  Patient has taken about a day and a half worth of the cefpodoxime and may continue this as UTI seems to be improving.  She was counseled to follow-up with her PCP and return to the ED for new or worsening symptoms, was also provided referral to urology if she has ongoing hematuria following treatment for UTI.  Patient agrees with plan. ? ?  ? ? ?FINAL CLINICAL IMPRESSION(S) / ED DIAGNOSES  ? ?Final diagnoses:  ?Acute cystitis with hematuria  ? ? ? ?Rx / DC Orders  ? ?ED Discharge Orders   ? ? None  ? ?  ? ? ? ?Note:  This document was prepared using Dragon voice recognition software and may include unintentional dictation errors. ?  ?Chesley Noon, MD ?04/22/21 1312 ? ?

## 2021-04-22 NOTE — ED Triage Notes (Signed)
Pt comes into the ED via POV c/o dysuria.  Pt states that she was seen at an [facility] Tuesday where they tested her urine and it showed blood.  Pt denies any abd or back pain.  Pt explains the only pain she is having is at the end of urination from her urethra.  Pt also states she feels as though she has the urge to go.  Pt states UC called her and told her to stop the abx they prescribed, because the UA didn't show an infection.  PT in NAD at this time with even and unlabored respirations.  ?

## 2021-04-22 NOTE — ED Notes (Signed)
Pt. Refused d/c VS. ?

## 2021-04-23 LAB — URINE CULTURE: Culture: <10000 — AB

## 2021-12-08 DIAGNOSIS — Z302 Encounter for sterilization: Secondary | ICD-10-CM | POA: Insufficient documentation

## 2021-12-08 DIAGNOSIS — Z9151 Personal history of suicidal behavior: Secondary | ICD-10-CM | POA: Insufficient documentation

## 2021-12-08 DIAGNOSIS — O4212 Full-term premature rupture of membranes, onset of labor more than 24 hours following rupture: Secondary | ICD-10-CM | POA: Insufficient documentation

## 2022-04-17 ENCOUNTER — Encounter: Payer: Self-pay | Admitting: Emergency Medicine

## 2022-04-17 ENCOUNTER — Ambulatory Visit
Admission: EM | Admit: 2022-04-17 | Discharge: 2022-04-17 | Disposition: A | Discharge status: Home / Self Care | Payer: Managed Care, Other (non HMO) | Attending: Family Medicine | Admitting: Family Medicine

## 2022-04-17 DIAGNOSIS — S0501XA Injury of conjunctiva and corneal abrasion without foreign body, right eye, initial encounter: Secondary | ICD-10-CM | POA: Diagnosis not present

## 2022-04-17 DIAGNOSIS — H16001 Unspecified corneal ulcer, right eye: Secondary | ICD-10-CM

## 2022-04-17 MED ORDER — ERYTHROMYCIN 5 MG/GM OP OINT: TOPICAL_OINTMENT | OPHTHALMIC | 0 refills | Status: DC

## 2022-04-17 NOTE — ED Triage Notes (Signed)
Patient c/o right eye redness and drainage that started on Wed.  Patient states that her son had pink eye on Monday.  Patient states that she has been using her son's prescribed eye drops but has not worked.

## 2022-04-17 NOTE — ED Provider Notes (Signed)
[facility]    CSN: [id number] Arrival date & time: 04/17/22  0809      History   Chief Complaint Chief Complaint  Patient presents with   Eye Drainage    right    HPI HPI  Nancy Koch is a 27 y.o. female.    Nancy Koch presents for right eye pain with drainage that started on Wednesday.  Reports her son also has pinkeye and she has been using his eyedrops.  They are not helping. Nancy Koch feels like something is in her eye but does not see anything.    Nancy Koch does not wear glasses or contacts.  Nancy Koch has not had any trouble seeing.  However, eye pain remains.  Nancy Koch has otherwise been well and has no additional concerns today.  Denies fever, or cold symptoms, headache, jaw pain or rash.   Past Medical History:  Diagnosis Date   Appendicitis    UTI (urinary tract infection)     Patient Active Problem List   Diagnosis Date Noted   Acute UTI 04/20/2021   Gross hematuria 04/20/2021   Pelvic pain in female 10/25/2015   Postpartum endometritis 10/22/2015   Active preterm labor 10/11/2015   Acute appendicitis 08/13/2014   Depressive disorder 12/29/2011   Major depressive disorder, single episode 12/29/2011    Past Surgical History:  Procedure Laterality Date   APPENDECTOMY     LAPAROSCOPIC APPENDECTOMY N/A 08/13/2014   Procedure: APPENDECTOMY LAPAROSCOPIC;  Surgeon: Lattie Haw, MD;  Location: [facility];  Service: General;  Laterality: N/A;   LAPAROSCOPY N/A 11/10/2014   Procedure: LAPAROSCOPY DIAGNOSTIC;  Surgeon: Suzy Bouchard, MD;  Location: [facility];  Service: Gynecology;  Laterality: N/A;    OB History     Gravida  2   Para  1   Term  1   Preterm      AB      Living  1      SAB      IAB      Ectopic      Multiple  0   Live Births  1            Home Medications    Prior to Admission medications   Medication Sig Start Date End Date Taking? Authorizing Provider  erythromycin ophthalmic ointment Place a 1/2 inch  ribbon of ointment into the lower eyelid.  3 times daily for 7 days 04/17/22  Yes Azar South, DO  ibuprofen (ADVIL) 400 MG tablet TK 1 T PO TID PRN 07/27/18   [provider]  levonorgestrel-ethinyl estradiol (INTROVALE) 0.15-0.03 MG tablet Take 1 tablet by mouth daily. 07/31/18   Duanne Limerick, MD    Family History Family History  Problem Relation Age of Onset   Cancer Mother        cervical   Diabetes Father    Diabetes Sister    Cancer Maternal Grandmother    Depression Paternal Grandmother    Diabetes Paternal Grandfather     Social History Social History   Tobacco Use   Smoking status: Every Day    Packs/day: 1    Types: Cigarettes   Smokeless tobacco: Never  Vaping Use   Vaping Use: Every day  Substance Use Topics   Alcohol use: Yes    Comment: social   Drug use: No     Allergies   Patient has no known allergies.   Review of Systems Review of Systems : negative unless otherwise stated in HPI.  Physical Exam Triage Vital Signs ED Triage Vitals  Enc Vitals Group     BP      Pulse      Resp      Temp      Temp src      SpO2      Weight      Height      Head Circumference      Peak Flow      Pain Score      Pain Loc      Pain Edu?      Excl. in GC?    No data found.  Updated Vital Signs BP 117/71 (BP Location: Right Arm)   Pulse 87   Temp 97.9 F (36.6 C) (Oral)   Resp 14   Ht 5\' 6"  (1.676 m)   Wt 78 kg   LMP 04/07/2021 (Approximate)   SpO2 97%   BMI 27.76 kg/m   Visual Acuity Right Eye Distance: 20/30 uncorrected Left Eye Distance: 20/20 uncorrected Bilateral Distance: 20/20 uncorrected  Right Eye Near:   Left Eye Near:    Bilateral Near:     Physical Exam  GEN: pleasant well appearing female, in no acute distress  NECK: normal ROM  CV: regular rate  RESP: no increased work of breathing, clear to ascultation bilaterally EYES:     General: Lids are normal.  Vision grossly intact. Gaze aligned appropriately.         Right eye: Frequent watery discharge, conjunctival injection, no chemosis or hemorrhage no hordeolum       Left eye: No foreign body, discharge or hordeolum.     Extraocular Movements: Extraocular movements intact without pain.     Comments: fluorescein stain performed, Corneal abrasions in the 5 o'clock position 8 through 5 o'clock positions and 3 o'clock position, inspected for foreign bodies a off-white to pinkish foreign body in the lower lid of the right eye was found SKIN: warm and dry   UC Treatments / Results  Labs (all labs ordered are listed, but only abnormal results are displayed) Labs Reviewed - No data to display  EKG   Radiology No results found.  Procedures Procedures (including critical care time)  Medications Ordered in UC Medications - No data to display  Initial Impression / Assessment and Plan / UC Course  I have reviewed the triage vital signs and the nursing notes.  Pertinent labs & imaging results that were available during my care of the patient were reviewed by me and considered in my medical decision making (see chart for details).     Patient is a 27 y.o. female who presents after  right eye pain with watery discharge for the past few days.  On exam, she has a evidence of conjunctivitis with corneal ulcer and abrasions on the right.  Treat with erythromycin ointment.  Treat with Polytrim eye drops.  Advised to follow-up with an ophthalmologist or optometrist, if  discomfort/pain is not improving after 7 day course. Recommended pt pick up eye patch from the pharmacy, if desired. Understanding voiced.  Given follow-up information for Dr. Inez Pilgrim at [facility],  the on-call ophthalmologist for today.  Discussed MDM, treatment plan and plan for follow-up with patient who agrees with plan.  Final Clinical Impressions(s) / UC Diagnoses   Final diagnoses:  Abrasion of right cornea, initial encounter  Corneal ulcer of right eye      Discharge Instructions      See handout on corneal ulcer.  By the pharmacy to get her in to that appointment.  You may want to get a eye patch to help you see why your eye is healing.  You can pick 1 of these up at the pharmacy.  Motrin and Tylenol as needed for pain.  If symptoms do not resolve or get worse please follow-up with [facility], Dr Inez PilgrimBrasington.     ED Prescriptions     Medication Sig Dispense Auth. Provider   erythromycin ophthalmic ointment Place a 1/2 inch ribbon of ointment into the lower eyelid.  3 times daily for 7 days 7 g Katha CabalBrimage, Nikhil Osei, DO      PDMP not reviewed this encounter.   Katha CabalBrimage, Mallori Araque, DO 04/17/22 [phone]

## 2022-04-17 NOTE — Discharge Instructions (Addendum)
See handout on corneal ulcer.  By the pharmacy to get her in to that appointment.  You may want to get a eye patch to help you see why your eye is healing.  You can pick 1 of these up at the pharmacy.  Motrin and Tylenol as needed for pain.  If symptoms do not resolve or get worse please follow-up with [facility], Dr Inez Pilgrim.

## 2022-04-30 DIAGNOSIS — O99012 Anemia complicating pregnancy, second trimester: Secondary | ICD-10-CM | POA: Insufficient documentation

## 2022-08-01 ENCOUNTER — Ambulatory Visit
Admission: EM | Admit: 2022-08-01 | Discharge: 2022-08-01 | Disposition: A | Discharge status: Home / Self Care | Payer: Managed Care, Other (non HMO) | Attending: Family Medicine | Admitting: Family Medicine

## 2022-08-01 DIAGNOSIS — N61 Mastitis without abscess: Secondary | ICD-10-CM | POA: Diagnosis not present

## 2022-08-01 DIAGNOSIS — Z1152 Encounter for screening for COVID-19: Secondary | ICD-10-CM | POA: Insufficient documentation

## 2022-08-01 LAB — BASIC METABOLIC PANEL
Anion gap: 10 (ref 5–15)
BUN: 10 mg/dL (ref 6–20)
CO2: 21 mmol/L — ABNORMAL LOW (ref 22–32)
Calcium: 9.1 mg/dL (ref 8.9–10.3)
Chloride: 106 mmol/L (ref 98–111)
Creatinine, Ser: 0.61 mg/dL (ref 0.44–1.00)
GFR, Estimated: >60 mL/min (ref >60)
Glucose, Bld: 120 mg/dL — ABNORMAL HIGH (ref 70–99)
Potassium: 3.5 mmol/L (ref 3.5–5.1)
Sodium: 137 mmol/L (ref 135–145)

## 2022-08-01 LAB — CBC WITH DIFFERENTIAL/PLATELET
Abs Immature Granulocytes: 0.03 10*3/uL (ref 0.00–0.07)
Basophils Absolute: 0.1 10*3/uL (ref 0.0–0.1)
Basophils Relative: 1 %
Eosinophils Absolute: 0.2 10*3/uL (ref 0.0–0.5)
Eosinophils Relative: 2 %
HCT: 33.8 % — ABNORMAL LOW (ref 36.0–46.0)
Hemoglobin: 11 g/dL — ABNORMAL LOW (ref 12.0–15.0)
Immature Granulocytes: 0 %
Lymphocytes Relative: 11 %
Lymphs Abs: 1.2 10*3/uL (ref 0.7–4.0)
MCH: 27.5 pg (ref 26.0–34.0)
MCHC: 32.5 g/dL (ref 30.0–36.0)
MCV: 84.5 fL (ref 80.0–100.0)
Monocytes Absolute: 0.6 10*3/uL (ref 0.1–1.0)
Monocytes Relative: 6 %
Neutro Abs: 8.6 10*3/uL — ABNORMAL HIGH (ref 1.7–7.7)
Neutrophils Relative %: 80 %
Platelets: 229 10*3/uL (ref 150–400)
RBC: 4 MIL/uL (ref 3.87–5.11)
RDW: 14.6 % (ref 11.5–15.5)
WBC: 10.6 10*3/uL — ABNORMAL HIGH (ref 4.0–10.5)
nRBC: 0 % (ref 0.0–0.2)

## 2022-08-01 LAB — RESP PANEL BY RT-PCR (RSV, FLU A&B, COVID)  RVPGX2
Influenza A by PCR: NEGATIVE
Influenza B by PCR: NEGATIVE
Resp Syncytial Virus by PCR: NEGATIVE
SARS Coronavirus 2 by RT PCR: NEGATIVE

## 2022-08-01 MED ORDER — SULFAMETHOXAZOLE-TRIMETHOPRIM 800-160 MG PO TABS: 1.0000 | ORAL_TABLET | Freq: Two times a day (BID) | ORAL | 0 refills | Status: AC

## 2022-08-01 NOTE — ED Triage Notes (Signed)
Pt presents to UC c/o fever 102.5 has been highest, pt states she is x3 weeks postpartum. Pt has been on abx she was prescribed for mastitis. Pt states the lowest temp has come down to is 99.5.

## 2022-08-01 NOTE — ED Provider Notes (Addendum)
[facility]    CSN: [id number] Arrival date & time: 08/01/22  1426      History   Chief Complaint Chief Complaint  Patient presents with   Fever    HPI Nancy Koch is a 27 y.o. female.   HPI  Nancy Koch presents for fever ongoing for 3 days. Tmax 102.3 F, per female partner. Was prescribed dicloxacillin for mastitis but she is not feeling better. She had a laparoscopic tubal ligation the day after she delivered (vaginal) 3 weeks ago. Had a labial tear that was repaired after delivery.  Denies urinary symptoms. Reports history of post-partum uterine infection with her first child and is concerned that she has another one. She called her GYN office again today and was told to go to the UC for COVID testing as she recently visited her family member in the hospital. Someone in her family fell off a ladder and is in the ICU.  She doesn't want to bring anything home her newborn and requests COVID and influenza testing.  Denies chest pain, new abdominal pain, sore throat, cough, rhinorrhea. Does have body aches and chills.   Past Medical History:  Diagnosis Date   Appendicitis    UTI (urinary tract infection)     Patient Active Problem List   Diagnosis Date Noted   Acute UTI 04/20/2021   Gross hematuria 04/20/2021   Pelvic pain in female 10/25/2015   Postpartum endometritis 10/22/2015   Active preterm labor 10/11/2015   Acute appendicitis 08/13/2014   Depressive disorder 12/29/2011   Major depressive disorder, single episode 12/29/2011    Past Surgical History:  Procedure Laterality Date   APPENDECTOMY     LAPAROSCOPIC APPENDECTOMY N/A 08/13/2014   Procedure: APPENDECTOMY LAPAROSCOPIC;  Surgeon: Lattie Haw, MD;  Location: [facility] ORS;  Service: General;  Laterality: N/A;   LAPAROSCOPY N/A 11/10/2014   Procedure: LAPAROSCOPY DIAGNOSTIC;  Surgeon: Suzy Bouchard, MD;  Location: [facility] ORS;  Service: Gynecology;  Laterality: N/A;    OB History     Gravida  2    Para  1   Term  1   Preterm      AB      Living  1      SAB      IAB      Ectopic      Multiple  0   Live Births  1            Home Medications    Prior to Admission medications   Medication Sig Start Date End Date Taking? Authorizing Provider  dicloxacillin (DYNAPEN) 500 MG capsule Take by mouth. 07/31/22 08/10/22 Yes [provider]  sulfamethoxazole-trimethoprim (BACTRIM DS) 800-160 MG tablet Take 1 tablet by mouth 2 (two) times daily for 10 days. 08/01/22 08/11/22 Yes Bertina Guthridge, DO  erythromycin ophthalmic ointment Place a 1/2 inch ribbon of ointment into the lower eyelid.  3 times daily for 7 days 04/17/22   Katha Cabal, DO  ibuprofen (ADVIL) 400 MG tablet TK 1 T PO TID PRN 07/27/18   [provider]  levonorgestrel-ethinyl estradiol (INTROVALE) 0.15-0.03 MG tablet Take 1 tablet by mouth daily. 07/31/18   Duanne Limerick, MD    Family History Family History  Problem Relation Age of Onset   Cancer Mother        cervical   Diabetes Father    Diabetes Sister    Cancer Maternal Grandmother    Depression Paternal Grandmother    Diabetes Paternal  Grandfather     Social History Social History   Tobacco Use   Smoking status: Every Day    Current packs/day: 1.00    Types: Cigarettes   Smokeless tobacco: Never  Vaping Use   Vaping status: Every Day  Substance Use Topics   Alcohol use: Yes    Comment: social   Drug use: No     Allergies   Patient has no known allergies.   Review of Systems Review of Systems :negative unless otherwise stated in HPI.      Physical Exam Triage Vital Signs ED Triage Vitals  Encounter Vitals Group     BP 08/01/22 1435 133/81     Systolic BP Percentile --      Diastolic BP Percentile --      Pulse Rate 08/01/22 1435 83     Resp --      Temp 08/01/22 1435 99.7 F (37.6 C)     Temp Source 08/01/22 1435 Oral     SpO2 08/01/22 1435 99 %     Weight --      Height --      Head  Circumference --      Peak Flow --      Pain Score 08/01/22 1434 3     Pain Loc --      Pain Education --      Exclude from Growth Chart --    No data found.  Updated Vital Signs BP 133/81 (BP Location: Left Arm)   Pulse 83   Temp 99.7 F (37.6 C) (Oral)   SpO2 99%   Breastfeeding Yes   Visual Acuity Right Eye Distance:   Left Eye Distance:   Bilateral Distance:    Right Eye Near:   Left Eye Near:    Bilateral Near:     Physical Exam  GEN: alert, nontoxic appearing female, in no acute distress  EYES: extra occular movements intact, no scleral injection CV: regular rate and rhythm RESP: no increased work of breathing, clear to ascultation bilaterally Breasts: no suspicious masses, no nipple changes, lactating, no erythema or tenderness, bilateral breast TTP, erythematous warm patches on both breasts no appreciable streaking  ABD:  soft, RLQ and LLQ tenderness, non-distended  SKIN: warm and dry; see breasts above, surgical scars are well healed without surrounding discharge or erythema    UC Treatments / Results  Labs (all labs ordered are listed, but only abnormal results are displayed) Labs Reviewed  CBC WITH DIFFERENTIAL/PLATELET - Abnormal; Notable for the following components:      Result Value   WBC 10.6 (*)    Hemoglobin 11.0 (*)    HCT 33.8 (*)    Neutro Abs 8.6 (*)    All other components within normal limits  BASIC METABOLIC PANEL - Abnormal; Notable for the following components:   CO2 21 (*)    Glucose, Bld 120 (*)    All other components within normal limits  RESP PANEL BY RT-PCR (RSV, FLU A&B, COVID)  [id number]    EKG   Radiology No results found.  Procedures Procedures (including critical care time)  Medications Ordered in UC Medications - No data to display  Initial Impression / Assessment and Plan / UC Course  I have reviewed the triage vital signs and the nursing notes.  Pertinent labs & imaging results that were available during my  care of the patient were reviewed by me and considered in my medical decision making (see chart for details).  Patient is a 27 y.o. femalewho presents for fever and breast pain.  Overall, patient is nontoxic-appearing and well-hydrated.  Vital signs stable.  Elke is afebrile.  Exam concerning for mastitis. However has lower abdominal tenderness concerning for intraabdominal infection. Pt would ike to avoid the ED if possible.  COVID, influenza and RSV are all negative.  She has a very mild leukocytosis, WBC 10.6 with stable hemoglobin.  No significant electrolyte abnormalities.    Treat mastitis with Bactrim BID for 10 days for MRSA coverage in the event dicloxacillin has resistance.  Strict ED precautions given and patient voiced understanding.  Reviewed expectations regarding course of current medical issues.  All questions asked were answered.  Outlined signs and symptoms indicating need for more acute intervention. Patient verbalized understanding. After Visit Summary given.   Final Clinical Impressions(s) / UC Diagnoses   Final diagnoses:  Mastitis  Breast feeding status of mother     Discharge Instructions      You do not have COVID, flu or RSV.  You have a slightly elevated white blood cell count.  Stop by the pharmacy to pick up your antibiotics.    If your symptoms do not improve over the next 48 hours, go to the emergency department.  If your abdominal pain suddenly worsens or fever does not break, go to the emergency department.  If your blood pressure drops below 90/60, go to the emergency department.      ED Prescriptions     Medication Sig Dispense Auth. Provider   sulfamethoxazole-trimethoprim (BACTRIM DS) 800-160 MG tablet Take 1 tablet by mouth 2 (two) times daily for 10 days. 20 tablet Katha Cabal, DO      PDMP not reviewed this encounter.              Katha Cabal, DO 08/01/22 2007    Katha Cabal, DO 08/01/22 2008

## 2022-08-01 NOTE — Discharge Instructions (Addendum)
You do not have COVID, flu or RSV.  You have a slightly elevated white blood cell count.  Stop by the pharmacy to pick up your antibiotics.    If your symptoms do not improve over the next 48 hours, go to the emergency department.  If your abdominal pain suddenly worsens or fever does not break, go to the emergency department.  If your blood pressure drops below 90/60, go to the emergency department.

## 2023-02-15 ENCOUNTER — Telehealth: Payer: Managed Care, Other (non HMO) | Admitting: Physician Assistant

## 2023-02-15 DIAGNOSIS — H1031 Unspecified acute conjunctivitis, right eye: Secondary | ICD-10-CM | POA: Diagnosis not present

## 2023-02-15 MED ORDER — ERYTHROMYCIN 5 MG/GM OP OINT: 1.0000 | TOPICAL_OINTMENT | Freq: Four times a day (QID) | OPHTHALMIC | 0 refills | Status: AC

## 2023-02-15 NOTE — Patient Instructions (Signed)
 Nancy Koch, thank you for joining Nancy Velma Lunger, PA-C for today's virtual visit.  While this provider is not your primary care provider (PCP), if your PCP is located in our provider database this encounter information will be shared with them immediately following your visit.   A [facility] MyChart account gives you access to today's visit and all your visits, tests, and labs performed at [facility]  click here if you don't have a [facility] MyChart account or go to [email or web address]  Consent: (Patient) Nancy Koch provided verbal consent for this virtual visit at the beginning of the encounter.  Current Medications: No current outpatient medications on file.   Medications ordered in this encounter:  No orders of the defined types were placed in this encounter.    *If you need refills on other medications prior to your next appointment, please contact your pharmacy*  Follow-Up: Call back or seek an in-person evaluation if the symptoms worsen or if the condition fails to improve as anticipated.  [facility] [phone]  Other Instructions Bacterial Conjunctivitis, Adult Bacterial conjunctivitis is an infection of your conjunctiva. This is the clear membrane that covers the white part of your eye and the inner part of your eyelid. This infection can make your eye: Red or pink. Itchy or irritated. This condition spreads easily from person to person (is contagious) and from one eye to the other eye. What are the causes? This condition is caused by germs (bacteria). You may get the infection if you come into close contact with: A person who has the infection. Items that have germs on them (are contaminated), such as face towels, contact lens solution, or eye makeup. What increases the risk? You are more likely to get this condition if: You have contact with people who have the infection. You wear contact lenses. You have a sinus  infection. You have had a recent eye injury or surgery. You have a weak body defense system (immune system). You have dry eyes. What are the signs or symptoms?  Thick, yellowish discharge from the eye. Tearing or watery eyes. Itchy eyes. Burning feeling in your eyes. Eye redness. Swollen eyelids. Blurred vision. How is this treated?  Antibiotic eye drops or ointment. Antibiotic medicine taken by mouth. This is used for infections that do not get better with drops or ointment or that last more than 10 days. Cool, wet cloths placed on the eyes. Artificial tears used 2-6 times a day. Follow these instructions at home: Medicines Take or apply your antibiotic medicine as told by your doctor. Do not stop using it even if you start to feel better. Take or apply over-the-counter and prescription medicines only as told by your doctor. Do not touch your eyelid with the eye-drop bottle or the ointment tube. Managing discomfort Wipe any fluid from your eye with a warm, wet washcloth or a cotton ball. Place a clean, cool, wet cloth on your eye. Do this for 10-20 minutes, 3-4 times a day. General instructions Do not wear contacts until the infection is gone. Wear glasses until your doctor says it is okay to wear contacts again. Do not wear eye makeup until the infection is gone. Throw away old eye makeup. Change or wash your pillowcase every day. Do not share towels or washcloths. Wash your hands often with soap and water for at least 20 seconds and especially before touching your face or eyes. Use paper towels to dry your hands. Do not touch  or rub your eyes. Do not drive or use heavy machinery if your vision is blurred. Contact a doctor if: You have a fever. You do not get better after 10 days. Get help right away if: You have a fever and your symptoms get worse all of a sudden. You have very bad pain when you move your eye. Your face: Hurts. Is red. Is swollen. You have sudden loss  of vision. Summary Bacterial conjunctivitis is an infection of your conjunctiva. This infection spreads easily from person to person. Wash your hands often with soap and water for at least 20 seconds and especially before touching your face or eyes. Use paper towels to dry your hands. Take or apply your antibiotic medicine as told by your doctor. Contact a doctor if you have a fever or you do not get better after 10 days. This information is not intended to replace advice given to you by your health care provider. Make sure you discuss any questions you have with your health care provider. Document Revised: 04/08/2020 Document Reviewed: 04/08/2020 Elsevier Patient Education  2024 [employer] Inc.   If you have been instructed to have an in-person evaluation today at a local Urgent Care facility, please use the link below. It will take you to a list of all of our available [facility], including address, phone number and hours of operation. Please do not delay care.  [facility]  If you or a family member do not have a primary care provider, use the link below to schedule a visit and establish care. When you choose a [facility] primary care physician or advanced practice provider, you gain a long-term partner in health. Find a Primary Care Provider  Learn more about [facility] in-office and virtual care options: [facility] - Get Care Now

## 2023-02-15 NOTE — Progress Notes (Signed)
 Virtual Visit Consent   Nancy Koch, you are scheduled for a virtual visit with a [facility] provider today. Just as with appointments in the office, your consent must be obtained to participate. Your consent will be active for this visit and any virtual visit you may have with one of our providers in the next 365 days. If you have a MyChart account, a copy of this consent can be sent to you electronically.  As this is a virtual visit, video technology does not allow for your provider to perform a traditional examination. This may limit your provider's ability to fully assess your condition. If your provider identifies any concerns that need to be evaluated in person or the need to arrange testing (such as labs, EKG, etc.), we will make arrangements to do so. Although advances in technology are sophisticated, we cannot ensure that it will always work on either your end or our end. If the connection with a video visit is poor, the visit may have to be switched to a telephone visit. With either a video or telephone visit, we are not always able to ensure that we have a secure connection.  By engaging in this virtual visit, you consent to the provision of healthcare and authorize for your insurance to be billed (if applicable) for the services provided during this visit. Depending on your insurance coverage, you may receive a charge related to this service.  I need to obtain your verbal consent now. Are you willing to proceed with your visit today? Nancy Koch has provided verbal consent on 02/15/2023 for a virtual visit (video or telephone). Nancy Koch, [place]  Date: 02/15/2023 10:40 AM  Virtual Visit via Video Note   I, Nancy Koch, connected with  JAMEKIA GANNETT  ([id number], 1995/07/22) on 02/15/23 at 10:30 AM EST by a video-enabled telemedicine application and verified that I am speaking with the correct person using two identifiers.  Location: Patient: Virtual Visit Location  Patient: Home Provider: Virtual Visit Location Provider: Home Office   I discussed the limitations of evaluation and management by telemedicine and the availability of in person appointments. The patient expressed understanding and agreed to proceed.    History of Present Illness: Nancy Koch is a 28 y.o. who identifies as a female who was assigned female at birth, and is being seen today for possible pink eye. Notes son currently being treated for pink eye that he got from a classmate at school. Endorses R ye irritation and redness with drainage. This morning was matted. No substantial symptoms of L eye. Denies fever, chills. Some mild nasal congestion but no other URI symptoms. Denies contact lens use.   HPI: HPI  Problems:  Patient Active Problem List   Diagnosis Date Noted   Acute UTI 04/20/2021   Gross hematuria 04/20/2021   Pelvic pain in female 10/25/2015   Postpartum endometritis 10/22/2015   Active preterm labor 10/11/2015   Acute appendicitis 08/13/2014   Depressive disorder 12/29/2011   Major depressive disorder, single episode 12/29/2011    Allergies: No Known Allergies Medications:  Current Outpatient Medications:    erythromycin  ophthalmic ointment, Place 1 Application into the right eye 4 (four) times daily for 7 days., Disp: 28 g, Rfl: 0  Observations/Objective: Patient is well-developed, well-nourished in no acute distress.  Resting comfortably at home.  Head is normocephalic, atraumatic.  No labored breathing. Speech is clear and coherent with logical content.  Patient is alert and oriented at baseline.  R conjunctival injection with drainage. No L conjunctival injection noted. EOMI.  Assessment and Plan: 1. Acute bacterial conjunctivitis of right eye (Primary) - erythromycin  ophthalmic ointment; Place 1 Application into the right eye 4 (four) times daily for 7 days.  Dispense: 28 g; Refill: 0  Supportive measures and OTC medications reviewed. Erythromycin   OP per orders. Strict in person follow-up precautions reviewed with patient.   Follow Up Instructions: I discussed the assessment and treatment plan with the patient. The patient was provided an opportunity to ask questions and all were answered. The patient agreed with the plan and demonstrated an understanding of the instructions.  A copy of instructions were sent to the patient via MyChart unless otherwise noted below.   The patient was advised to call back or seek an in-person evaluation if the symptoms worsen or if the condition fails to improve as anticipated.    Nancy Velma Lunger, PA-C

## 2023-03-30 ENCOUNTER — Ambulatory Visit
Admission: EM | Admit: 2023-03-30 | Discharge: 2023-03-30 | Disposition: A | Discharge status: Home / Self Care | Attending: Physician Assistant | Admitting: Physician Assistant

## 2023-03-30 DIAGNOSIS — R0981 Nasal congestion: Secondary | ICD-10-CM | POA: Insufficient documentation

## 2023-03-30 DIAGNOSIS — J069 Acute upper respiratory infection, unspecified: Secondary | ICD-10-CM | POA: Insufficient documentation

## 2023-03-30 DIAGNOSIS — J029 Acute pharyngitis, unspecified: Secondary | ICD-10-CM | POA: Insufficient documentation

## 2023-03-30 LAB — GROUP A STREP BY PCR: Group A Strep by PCR: NOT DETECTED

## 2023-03-30 MED ORDER — LIDOCAINE VISCOUS HCL 2 % MT SOLN: 15.0000 mL | OROMUCOSAL | 0 refills | Status: DC | PRN

## 2023-03-30 MED ORDER — IPRATROPIUM BROMIDE 0.06 % NA SOLN: 2.0000 | Freq: Four times a day (QID) | NASAL | 0 refills | Status: DC

## 2023-03-30 NOTE — Discharge Instructions (Signed)

## 2023-03-30 NOTE — ED Provider Notes (Signed)
 [facility]    CSN: [id number] Arrival date & time: 03/30/23  4098      History   Chief Complaint Chief Complaint  Patient presents with   Sore Throat    HPI Nancy Koch is a 28 y.o. female presenting for onset of sore throat and nasal congestion yesterday.  Reports of burning sensation in her throat that extends to the upper part of her chest.  Denies fever, fatigue, headaches, body aches, cough, shortness of breath, abdominal pain, nausea or vomiting.  History of acid reflux but says this feels different.  No sick contacts.  No OTC meds tried.  HPI  Past Medical History:  Diagnosis Date   Appendicitis    UTI (urinary tract infection)     Patient Active Problem List   Diagnosis Date Noted   Acute UTI 04/20/2021   Gross hematuria 04/20/2021   Pelvic pain in female 10/25/2015   Postpartum endometritis 10/22/2015   Active preterm labor 10/11/2015   Acute appendicitis 08/13/2014   Depressive disorder 12/29/2011   Major depressive disorder, single episode 12/29/2011    Past Surgical History:  Procedure Laterality Date   APPENDECTOMY     LAPAROSCOPIC APPENDECTOMY N/A 08/13/2014   Procedure: APPENDECTOMY LAPAROSCOPIC;  Surgeon: Lattie Haw, MD;  Location: [facility];  Service: General;  Laterality: N/A;   LAPAROSCOPY N/A 11/10/2014   Procedure: LAPAROSCOPY DIAGNOSTIC;  Surgeon: Suzy Bouchard, MD;  Location: [facility];  Service: Gynecology;  Laterality: N/A;   TUBAL LIGATION      OB History     Gravida  2   Para  1   Term  1   Preterm      AB      Living  1      SAB      IAB      Ectopic      Multiple  0   Live Births  1            Home Medications    Prior to Admission medications   Medication Sig Start Date End Date Taking? Authorizing Provider  ipratropium (ATROVENT) 0.06 % nasal spray Place 2 sprays into both nostrils 4 (four) times daily. 03/30/23  Yes Eusebio Friendly B, PA-C  lidocaine (XYLOCAINE) 2 % solution  Use as directed 15 mLs in the mouth or throat every 3 (three) hours as needed for mouth pain (swish and spit). 03/30/23  Yes Shirlee Latch, PA-C    Family History Family History  Problem Relation Age of Onset   Cancer Mother        cervical   Diabetes Father    Diabetes Sister    Cancer Maternal Grandmother    Depression Paternal Grandmother    Diabetes Paternal Grandfather     Social History Social History   Tobacco Use   Smoking status: Every Day    Current packs/day: 1.00    Types: Cigarettes   Smokeless tobacco: Never  Vaping Use   Vaping status: Every Day  Substance Use Topics   Alcohol use: Yes    Comment: social   Drug use: No     Allergies   Patient has no known allergies.   Review of Systems Review of Systems  Constitutional:  Negative for chills, diaphoresis, fatigue and fever.  HENT:  Positive for congestion, rhinorrhea and sore throat. Negative for ear pain, sinus pressure and sinus pain.   Respiratory:  Negative for cough and shortness of breath.   Cardiovascular:  Negative for chest pain.  Gastrointestinal:  Negative for abdominal pain, nausea and vomiting.  Musculoskeletal:  Negative for arthralgias and myalgias.  Skin:  Negative for rash.  Neurological:  Negative for weakness and headaches.  Hematological:  Negative for adenopathy.     Physical Exam Triage Vital Signs ED Triage Vitals  Encounter Vitals Group     BP 03/30/23 1018 125/82     Systolic BP Percentile --      Diastolic BP Percentile --      Pulse Rate 03/30/23 1018 73     Resp 03/30/23 1018 16     Temp 03/30/23 1018 98.6 F (37 C)     Temp Source 03/30/23 1018 Oral     SpO2 03/30/23 1018 97 %     Weight --      Height --      Head Circumference --      Peak Flow --      Pain Score 03/30/23 1017 4     Pain Loc --      Pain Education --      Exclude from Growth Chart --    No data found.  Updated Vital Signs BP 125/82 (BP Location: Right Arm)   Pulse 73   Temp 98.6  F (37 C) (Oral)   Resp 16   SpO2 97%      Physical Exam Vitals and nursing note reviewed.  Constitutional:      General: She is not in acute distress.    Appearance: Normal appearance. She is well-developed. She is not ill-appearing or toxic-appearing.  HENT:     Head: Normocephalic and atraumatic.     Right Ear: Tympanic membrane, ear canal and external ear normal.     Left Ear: Tympanic membrane, ear canal and external ear normal.     Nose: Congestion present.     Mouth/Throat:     Mouth: Mucous membranes are moist.     Pharynx: Oropharynx is clear. Posterior oropharyngeal erythema present.  Eyes:     General: No scleral icterus.       Right eye: No discharge.        Left eye: No discharge.     Conjunctiva/sclera: Conjunctivae normal.  Cardiovascular:     Rate and Rhythm: Normal rate and regular rhythm.     Heart sounds: Normal heart sounds.  Pulmonary:     Effort: Pulmonary effort is normal. No respiratory distress.     Breath sounds: Normal breath sounds.  Musculoskeletal:     Cervical back: Neck supple.  Skin:    General: Skin is dry.  Neurological:     General: No focal deficit present.     Mental Status: She is alert. Mental status is at baseline.     Motor: No weakness.     Gait: Gait normal.  Psychiatric:        Mood and Affect: Mood normal.        Behavior: Behavior normal.      UC Treatments / Results  Labs (all labs ordered are listed, but only abnormal results are displayed) Labs Reviewed  GROUP A STREP BY PCR    EKG   Radiology No results found.  Procedures Procedures (including critical care time)  Medications Ordered in UC Medications - No data to display  Initial Impression / Assessment and Plan / UC Course  I have reviewed the triage vital signs and the nursing notes.  Pertinent labs & imaging results that were available during my  care of the patient were reviewed by me and considered in my medical decision making (see chart for  details).   28 year old female presents for onset of sore throat and nasal congestion yesterday.  No fever.  Strep negative.  Declines COVID and flu testing.  Viral illness versus GERD.  Supportive care encouraged.  Sent viscous lidocaine to pharmacy.  Advised DayQuil, NyQuil, nasal spray, Tylenol and consideration of Prilosec.  Reviewed return precautions.   Final Clinical Impressions(s) / UC Diagnoses   Final diagnoses:  Viral upper respiratory tract infection  Sore throat  Nasal congestion     Discharge Instructions      URI/COLD SYMPTOMS: Your exam today is consistent with a viral illness. Antibiotics are not indicated at this time. Use medications as directed, including cough syrup, nasal saline, and decongestants. Your symptoms should improve over the next few days and resolve within 7-10 days. Increase rest and fluids. F/u if symptoms worsen or predominate such as sore throat, ear pain, productive cough, shortness of breath, or if you develop high fevers or worsening fatigue over the next several days.       ED Prescriptions     Medication Sig Dispense Auth. Provider   lidocaine (XYLOCAINE) 2 % solution Use as directed 15 mLs in the mouth or throat every 3 (three) hours as needed for mouth pain (swish and spit). 100 mL Eusebio Friendly B, PA-C   ipratropium (ATROVENT) 0.06 % nasal spray Place 2 sprays into both nostrils 4 (four) times daily. 15 mL Shirlee Latch, PA-C      PDMP not reviewed this encounter.   Shirlee Latch, PA-C 03/30/23 1144

## 2023-03-30 NOTE — ED Triage Notes (Signed)
 Patient states that she's having a burning sensation at the bottom of her throat top of her chest. Sx since yesterday

## 2023-05-29 DIAGNOSIS — K8050 Calculus of bile duct without cholangitis or cholecystitis without obstruction: Secondary | ICD-10-CM | POA: Insufficient documentation

## 2023-05-30 ENCOUNTER — Ambulatory Visit
Admission: EM | Admit: 2023-05-30 | Discharge: 2023-05-30 | Disposition: A | Discharge status: Home / Self Care | Attending: Emergency Medicine | Admitting: Emergency Medicine

## 2023-05-30 DIAGNOSIS — J069 Acute upper respiratory infection, unspecified: Secondary | ICD-10-CM | POA: Diagnosis present

## 2023-05-30 DIAGNOSIS — H6501 Acute serous otitis media, right ear: Secondary | ICD-10-CM | POA: Diagnosis present

## 2023-05-30 DIAGNOSIS — H7292 Unspecified perforation of tympanic membrane, left ear: Secondary | ICD-10-CM | POA: Insufficient documentation

## 2023-05-30 DIAGNOSIS — J029 Acute pharyngitis, unspecified: Secondary | ICD-10-CM | POA: Diagnosis present

## 2023-05-30 LAB — RESP PANEL BY RT-PCR (FLU A&B, COVID) ARPGX2
Influenza A by PCR: NEGATIVE
Influenza B by PCR: NEGATIVE
SARS Coronavirus 2 by RT PCR: NEGATIVE

## 2023-05-30 LAB — GROUP A STREP BY PCR: Group A Strep by PCR: NOT DETECTED

## 2023-05-30 MED ORDER — FLUTICASONE PROPIONATE 50 MCG/ACT NA SUSP: 2.0000 | Freq: Every day | NASAL | 0 refills | Status: DC

## 2023-05-30 MED ORDER — IBUPROFEN 600 MG PO TABS: 600.0000 mg | ORAL_TABLET | Freq: Three times a day (TID) | ORAL | 0 refills | Status: DC | PRN

## 2023-05-30 NOTE — ED Provider Notes (Signed)
 HPI  SUBJECTIVE:  Nancy Koch is a 28 y.o. female who presents with sore throat, body aches, clear rhinorrhea from the left ear, fevers Tmax 100.7, headache starting yesterday.  No ear pain, change in hearing, nasal congestion, rhinorrhea, sinus pain or pressure postnasal drip, cough, nausea, vomiting.  She reports 2 days of diarrhea.  No change in her baseline abdominal pain.  No rash.  No drooling, trismus, sensation of throat swelling shut, neck stiffness, voice changes.  No known COVID, flu mono or strep exposure.  She had the COVID and flu vaccines.  She has been taking DayQuil with improvement in her symptoms.  Last dose was within 6 hours of evaluation.  She has been alternating 500 mg of Tylenol  with 200 mg of ibuprofen .  Symptoms are worse when the medications wear off.  She has a past medical history of gallbladder disease and is scheduled for cholecystectomy this week.  She also has history of thyroid disease, frequent otitis media.  No history of pulmonary disease.  LMP: Status post bilateral tubal ligation.  Denies possibility being pregnant.  PCP: [facility]    Past Medical History:  Diagnosis Date   Appendicitis    UTI (urinary tract infection)     Past Surgical History:  Procedure Laterality Date   APPENDECTOMY     LAPAROSCOPIC APPENDECTOMY N/A 08/13/2014   Procedure: APPENDECTOMY LAPAROSCOPIC;  Surgeon: Claudia Cuff, MD;  Location: [facility];  Service: General;  Laterality: N/A;   LAPAROSCOPY N/A 11/10/2014   Procedure: LAPAROSCOPY DIAGNOSTIC;  Surgeon: Carolynn Citrin, MD;  Location: [facility];  Service: Gynecology;  Laterality: N/A;   TUBAL LIGATION      Family History  Problem Relation Age of Onset   Cancer Mother        cervical   Diabetes Father    Diabetes Sister    Cancer Maternal Grandmother    Depression Paternal Grandmother    Diabetes Paternal Grandfather     Social History   Tobacco Use   Smoking status: Every Day    Current  packs/day: 1.00    Types: Cigarettes   Smokeless tobacco: Never  Vaping Use   Vaping status: Every Day  Substance Use Topics   Alcohol use: Yes    Comment: social   Drug use: No    No current facility-administered medications for this encounter.  Current Outpatient Medications:    fluticasone (FLONASE) 50 MCG/ACT nasal spray, Place 2 sprays into both nostrils daily., Disp: 16 g, Rfl: 0   ibuprofen  (ADVIL ) 600 MG tablet, Take 1 tablet (600 mg total) by mouth every 8 (eight) hours as needed., Disp: 30 tablet, Rfl: 0   levothyroxine (SYNTHROID) 88 MCG tablet, Take 1 tablet (88 mcg total) by mouth daily. Administer consistently in the morning on an empty stomach, at least 30 to 60 minutes before food. Alternatively, may consistently administer at night 3 to 4 hours after the last meal., Disp: , Rfl:   No Known Allergies   ROS  As noted in HPI.   Physical Exam  BP 123/75 (BP Location: Left Arm)   Pulse 88   Temp 98.3 F (36.8 C) (Oral)   Resp 16   Ht 5\' 5"  (1.651 m)   Wt 78.9 kg   SpO2 99%   BMI 28.96 kg/m   Constitutional: Well developed, well nourished, no acute distress Eyes: PERRL, EOMI, conjunctiva normal bilaterally HENT: Normocephalic, atraumatic,mucus membranes moist.  Positive nasal congestion.  Erythematous, swollen turbinates.  No  maxillary, frontal sinus tenderness.  Erythematous oropharynx, tonsils normal size without exudates.  Uvula midline.  Positive cobblestoning.  No postnasal drip.  Small perforation at the 6 o'clock position left TM with clear otorrhea.  TM otherwise normal.  Right TM intact, normal with serous fluid behind the TM Neck: No cervical lymphadenopathy Respiratory: Clear to auscultation bilaterally, no rales, no wheezing, no rhonchi Cardiovascular: Normal rate and rhythm, no murmurs, no gallops, no rubs GI: nondistended.  No splenomegaly. skin: No rash, skin intact Musculoskeletal: no deformities Neurologic: Alert & oriented x 3, CN III-XII  grossly intact, no motor deficits, sensation grossly intact Psychiatric: Speech and behavior appropriate   ED Course   Medications - No data to display  Orders Placed This Encounter  Procedures   Resp Panel by RT-PCR (Flu A&B, Covid) Anterior Nasal Swab    Standing Status:   Standing    Number of Occurrences:   1   Group A Strep by PCR    Standing Status:   Standing    Number of Occurrences:   1   Results for orders placed or performed during the hospital encounter of 05/30/23 (from the past 24 hours)  Resp Panel by RT-PCR (Flu A&B, Covid) Anterior Nasal Swab     Status: None   Collection Time: 05/30/23  8:34 AM   Specimen: Anterior Nasal Swab  Result Value Ref Range   SARS Coronavirus 2 by RT PCR NEGATIVE NEGATIVE   Influenza A by PCR NEGATIVE NEGATIVE   Influenza B by PCR NEGATIVE NEGATIVE  Group A Strep by PCR     Status: None   Collection Time: 05/30/23  8:34 AM   Specimen: Throat; Sterile Swab  Result Value Ref Range   Group A Strep by PCR NOT DETECTED NOT DETECTED   No results found.  ED Clinical Impression  1. Upper respiratory tract infection, unspecified type   2. Sore throat   3. Non-recurrent acute serous otitis media of right ear   4. Tympanic membrane perforation, left      ED Assessment/Plan     Patient presents with a URI/sore throat.  She also has serous otitis media on the right and a very small perforation in the left TM.  Advised patient to not get ear wet and to follow-up with ENT in 3 to 4 weeks to make sure that it has healed.   COVID, flu, strep negative.  Supportive treatment.   Checking strep and COVID.  Will prescribe penicillin if strep is positive, patient declined antiviral treatment if COVID is possible.  I feel that this is reasonable.  In the meantime, home with Tylenol /ibuprofen , Benadryl /Maalox, Flonase, Mucinex D.  She is to discontinue the DayQuil.  Saline nasal irrigation.  Work note.  Discussed labs, MDM, treatment plan, and  plan for follow-up with patient  patient agrees with plan.   Meds ordered this encounter  Medications   ibuprofen  (ADVIL ) 600 MG tablet    Sig: Take 1 tablet (600 mg total) by mouth every 8 (eight) hours as needed.    Dispense:  30 tablet    Refill:  0   fluticasone (FLONASE) 50 MCG/ACT nasal spray    Sig: Place 2 sprays into both nostrils daily.    Dispense:  16 g    Refill:  0      *This clinic note was created using Scientist, clinical (histocompatibility and immunogenetics). Therefore, there may be occasional mistakes despite careful proofreading. ?    Ethlyn Herd, MD 05/30/23 1000

## 2023-05-30 NOTE — ED Triage Notes (Signed)
 Pt c/o bodyaches,fever & sore throat x1 day. Tmax 100.7 yesterday. Has tried dayquil last dose around 0730.

## 2023-05-30 NOTE — Discharge Instructions (Signed)
 We will contact you if your COVID or strep come back positive.  I will prescribe 10 days of penicillin if positive, supportive treatment if COVID is positive. Take 1 gram of tylenol  with the 600 mg motrin  up to 3-4 times a day as needed for pain and fever. This is an effective combination. Drink extra fluids. Start taking Mucinex D to keep the mucus secretions thin. Discontinue dayquil. Use a neti pot or the NeilMed sinus rinse as often as you want to to reduce nasal congestion. Follow the directions on the box.  Some people find salt water gargles and  Traditional Medicinal's "Throat Coat" tea helpful. Take 5 mL of liquid Benadryl  and 5 mL of Maalox/Mylanta. Mix it together, and then hold it in your mouth for as long as you can and then swallow. You may do this 4 times a day.  Honey and lemon dissolved in hot water can also be soothing.  Go to www.goodrx.com  or www.costplusdrugs.com to look up your medications. This will give you a list of where you can find your prescriptions at the most affordable prices. Or ask the pharmacist what the cash price is, or if they have any other discount programs available to help make your medication more affordable. This can be less expensive than what you would pay with insurance.

## 2023-07-04 DIAGNOSIS — D72829 Elevated white blood cell count, unspecified: Secondary | ICD-10-CM | POA: Insufficient documentation

## 2023-07-04 DIAGNOSIS — E039 Hypothyroidism, unspecified: Secondary | ICD-10-CM | POA: Insufficient documentation

## 2023-10-17 ENCOUNTER — Ambulatory Visit
Admission: EM | Admit: 2023-10-17 | Discharge: 2023-10-17 | Disposition: A | Discharge status: Home / Self Care | Attending: Physician Assistant | Admitting: Physician Assistant

## 2023-10-17 ENCOUNTER — Ambulatory Visit (INDEPENDENT_AMBULATORY_CARE_PROVIDER_SITE_OTHER)

## 2023-10-17 DIAGNOSIS — R051 Acute cough: Secondary | ICD-10-CM

## 2023-10-17 DIAGNOSIS — R5383 Other fatigue: Secondary | ICD-10-CM | POA: Diagnosis present

## 2023-10-17 DIAGNOSIS — B349 Viral infection, unspecified: Secondary | ICD-10-CM | POA: Diagnosis present

## 2023-10-17 DIAGNOSIS — R0789 Other chest pain: Secondary | ICD-10-CM | POA: Insufficient documentation

## 2023-10-17 LAB — SARS CORONAVIRUS 2 BY RT PCR: SARS Coronavirus 2 by RT PCR: NEGATIVE

## 2023-10-17 MED ORDER — PROMETHAZINE-DM 6.25-15 MG/5ML PO SYRP: 5.0000 mL | ORAL_SOLUTION | Freq: Four times a day (QID) | ORAL | 0 refills | Status: DC | PRN

## 2023-10-17 NOTE — Discharge Instructions (Signed)
-  Negative COVID test -Chest xray is negative. No concern for pneumonia or bronchitis at this time -Symptoms are consistent with a virus. It can take a couple weeks to run its course -I sent cough meds. Increase fluids -If fever, increased chest pain or breathing issues please return for re-evaluation

## 2023-10-17 NOTE — ED Triage Notes (Signed)
 Pt c/o cough & chest congestion x4 days. States was exposed to PNA. Has tried OTC meds w/o relief.

## 2023-10-17 NOTE — ED Provider Notes (Signed)
 [facility]    CSN: [id number] Arrival date & time: 10/17/23  1203      History   Chief Complaint Chief Complaint  Patient presents with   Cough    HPI Nancy Koch is a 28 y.o. female presenting for onset of fatigue, productive cough, chest burning, and nasal congestion 4 days ago.    Denies fever, headaches, body aches, shortness of breath, abdominal pain, nausea or vomiting.   She says her husband and mother in law have been sick with similar symptoms. Her mother in law was diagnosed with bronchitis and pneumonia recently. She has been taking OTC cough meds without relief.    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   Vaginal delivery 07/10/2022   Anemia during pregnancy in second trimester 04/30/2022   Full-term premature rupture of membranes with onset of labor more than 24 hours following rupture 12/08/2021   History of suicide attempt 12/08/2021   Request for sterilization 12/08/2021   Acute UTI 04/20/2021   Gross hematuria 04/20/2021   Pelvic pain in female 10/25/2015   Postpartum endometritis 10/22/2015   Active preterm labor 10/11/2015   Rh D negative blood type 06/25/2015   Mastitis, left, acute 08/13/2014   Depressive disorder 12/29/2011   Major depressive disorder, single episode 12/29/2011    Past Surgical History:  Procedure Laterality Date   APPENDECTOMY     LAPAROSCOPIC APPENDECTOMY N/A 08/13/2014   Procedure: APPENDECTOMY LAPAROSCOPIC;  Surgeon: Charlie FORBES Fell, MD;  Location: [facility];  Service: General;  Laterality: N/A;   LAPAROSCOPY N/A 11/10/2014   Procedure: LAPAROSCOPY DIAGNOSTIC;  Surgeon: Debby JINNY Dinsmore, MD;  Location: [facility];  Service: Gynecology;  Laterality: N/A;   TUBAL LIGATION      OB History     Gravida  2   Para  1   Term  1   Preterm      AB       Living  1      SAB      IAB      Ectopic      Multiple  0   Live Births  1            Home Medications    Prior to Admission medications   Medication Sig Start Date End Date Taking? Authorizing Provider  promethazine -dextromethorphan (PROMETHAZINE -DM) 6.25-15 MG/5ML syrup Take 5 mLs by mouth 4 (four) times daily as needed. 10/17/23  Yes Arvis Huxley B, PA-C  fluticasone  (FLONASE ) 50 MCG/ACT nasal spray Place 2 sprays into both nostrils daily. 05/30/23   Van Knee, MD  ibuprofen  (ADVIL ) 600 MG tablet Take 1 tablet (600 mg total) by mouth every 8 (eight) hours as needed. 05/30/23   Mortenson, Ashley, MD  levothyroxine (SYNTHROID) 88 MCG tablet Take 1 tablet (88 mcg total) by mouth daily. Administer consistently in the morning on an empty stomach, at least 30 to 60 minutes before food. Alternatively, may consistently administer at night 3 to 4 hours after the last meal. 05/19/23 06/18/23  [provider]    Family History Family History  Problem Relation Age of Onset   Cancer Mother        cervical   Diabetes Father    Diabetes Sister    Cancer Maternal Grandmother    Depression Paternal Grandmother  Diabetes Paternal Grandfather     Social History Social History   Tobacco Use   Smoking status: Every Day    Current packs/day: 1.00    Types: Cigarettes   Smokeless tobacco: Never  Vaping Use   Vaping status: Every Day  Substance Use Topics   Alcohol use: Yes    Comment: social   Drug use: No     Allergies   Patient has no known allergies.   Review of Systems Review of Systems  Constitutional:  Positive for fatigue. Negative for chills, diaphoresis and fever.  HENT:  Positive for congestion and rhinorrhea. Negative for ear pain, sinus pressure, sinus pain and sore throat.   Respiratory:  Positive for cough. Negative for shortness of breath.   Cardiovascular:  Positive for chest pain.  Gastrointestinal:  Negative for abdominal pain, nausea  and vomiting.  Musculoskeletal:  Negative for arthralgias and myalgias.  Skin:  Negative for rash.  Neurological:  Negative for weakness and headaches.  Hematological:  Negative for adenopathy.     Physical Exam Triage Vital Signs  No data found.  Updated Vital Signs BP 118/81 (BP Location: Left Arm)   Pulse 70   Temp 98.2 F (36.8 C) (Oral)   Resp 16   Ht 5' 4 (1.626 m)   Wt 160 lb (72.6 kg)   SpO2 99%   BMI 27.46 kg/m      Physical Exam Vitals and nursing note reviewed.  Constitutional:      General: She is not in acute distress.    Appearance: Normal appearance. She is well-developed. She is not ill-appearing or toxic-appearing.  HENT:     Head: Normocephalic and atraumatic.     Right Ear: Tympanic membrane, ear canal and external ear normal.     Left Ear: Tympanic membrane, ear canal and external ear normal.     Nose: Congestion present.     Mouth/Throat:     Mouth: Mucous membranes are moist.     Pharynx: Oropharynx is clear. No posterior oropharyngeal erythema.  Eyes:     General: No scleral icterus.       Right eye: No discharge.        Left eye: No discharge.     Conjunctiva/sclera: Conjunctivae normal.  Cardiovascular:     Rate and Rhythm: Normal rate and regular rhythm.     Heart sounds: Normal heart sounds.  Pulmonary:     Effort: Pulmonary effort is normal. No respiratory distress.     Breath sounds: Normal breath sounds.  Musculoskeletal:     Cervical back: Neck supple.  Skin:    General: Skin is dry.  Neurological:     General: No focal deficit present.     Mental Status: She is alert. Mental status is at baseline.     Motor: No weakness.     Gait: Gait normal.  Psychiatric:        Mood and Affect: Mood normal.        Behavior: Behavior normal.      UC Treatments / Results  Labs (all labs ordered are listed, but only abnormal results are displayed) Labs Reviewed  SARS CORONAVIRUS 2 BY RT PCR    EKG   Radiology DG Chest 2  View Result Date: 10/17/2023 CLINICAL DATA:  Cough and congestion. EXAM: CHEST - 2 VIEW COMPARISON:  08/29/2010. FINDINGS: The heart size and mediastinal contours are within normal limits. Both lungs are clear. No pleural effusion or pneumothorax. The visualized skeletal structures are  unremarkable. IMPRESSION: No acute cardiopulmonary findings. Electronically Signed   By: Harrietta Sherry M.D.   On: 10/17/2023 13:20    Procedures Procedures (including critical care time)  Medications Ordered in UC Medications - No data to display  Initial Impression / Assessment and Plan / UC Course  I have reviewed the triage vital signs and the nursing notes.  Pertinent labs & imaging results that were available during my care of the patient were reviewed by me and considered in my medical decision making (see chart for details).   28 year old female presents for onset of cough, congestion, fatigue and chest burning 4 days ago. No fever, sore throat or SOB.   On exam, she has mild nasal congestion. Throat clear. Chest clear. Heart RRR.  Negative COVID testing.  CXR ordered. Normal.   Reviewed all results with patient.   Viral illness. Supportive care encouraged. Advised promethazine  DM, nasal spray, Tylenol  and increasing rest and fluids. Reviewed return precautions.  Acute illness with systemic symptoms.    Final Clinical Impressions(s) / UC Diagnoses   Final diagnoses:  Acute cough  Viral illness  Burning chest pain  Other fatigue     Discharge Instructions      -Negative COVID test -Chest xray is negative. No concern for pneumonia or bronchitis at this time -Symptoms are consistent with a virus. It can take a couple weeks to run its course -I sent cough meds. Increase fluids -If fever, increased chest pain or breathing issues please return for re-evaluation      ED Prescriptions     Medication Sig Dispense Auth. Provider   promethazine -dextromethorphan (PROMETHAZINE -DM)  6.25-15 MG/5ML syrup Take 5 mLs by mouth 4 (four) times daily as needed. 118 mL Arvis Jolan NOVAK, PA-C      PDMP not reviewed this encounter.      Arvis Jolan NOVAK, PA-C 10/17/23 1330

## 2023-11-22 ENCOUNTER — Ambulatory Visit
Admission: EM | Admit: 2023-11-22 | Discharge: 2023-11-22 | Disposition: A | Discharge status: Home / Self Care | Attending: Physician Assistant | Admitting: Physician Assistant

## 2023-11-22 DIAGNOSIS — H6501 Acute serous otitis media, right ear: Secondary | ICD-10-CM | POA: Diagnosis not present

## 2023-11-22 DIAGNOSIS — R42 Dizziness and giddiness: Secondary | ICD-10-CM | POA: Diagnosis not present

## 2023-11-22 DIAGNOSIS — R519 Headache, unspecified: Secondary | ICD-10-CM

## 2023-11-22 MED ORDER — IPRATROPIUM BROMIDE 0.06 % NA SOLN: 2.0000 | Freq: Four times a day (QID) | NASAL | 0 refills | Status: AC

## 2023-11-22 MED ORDER — MECLIZINE HCL 25 MG PO TABS: 25.0000 mg | ORAL_TABLET | Freq: Three times a day (TID) | ORAL | 0 refills | Status: AC | PRN

## 2023-11-22 MED ORDER — IBUPROFEN 600 MG PO TABS: 600.0000 mg | ORAL_TABLET | Freq: Four times a day (QID) | ORAL | 0 refills | Status: AC | PRN

## 2023-11-22 NOTE — ED Triage Notes (Signed)
 Pt c/o HA,nausea & dizziness that started this AM. States worse when moving around or changing positions. Denies any hx of vertigo. No OTC meds.

## 2023-11-22 NOTE — ED Provider Notes (Signed)
 [facility]    CSN: [id number] Arrival date & time: 11/22/23  0944      History   Chief Complaint Chief Complaint  Patient presents with   Dizziness   Headache    HPI Nancy Koch is a 28 y.o. female presenting for mild headache, dizziness and nausea that began this morning.  Dizziness worse with any movement such as standing from a seated position or moving head.  She says the headache is 3-4 out of 10.  Reports intermittent congestion over the past couple months.  Denies any congestion currently, ear pain, ear drainage, sore throat, sinus pain, chest pain, cough, palpitations, shortness of breath, weakness, presyncope or syncope.  No falls or head trauma.  Has not taken any OTC meds.  No significant history of recurrent headaches or dizziness/vertigo.  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   Vaginal delivery 07/10/2022   Anemia during pregnancy in second trimester 04/30/2022   Full-term premature rupture of membranes with onset of labor more than 24 hours following rupture 12/08/2021   History of suicide attempt 12/08/2021   Request for sterilization 12/08/2021   Acute UTI 04/20/2021   Gross hematuria 04/20/2021   Pelvic pain in female 10/25/2015   Postpartum endometritis 10/22/2015   Active preterm labor 10/11/2015   Rh D negative blood type 06/25/2015   Mastitis, left, acute 08/13/2014   Depression 12/29/2011   Major depressive disorder, single episode 12/29/2011    Past Surgical History:  Procedure Laterality Date   APPENDECTOMY     LAPAROSCOPIC APPENDECTOMY N/A 08/13/2014   Procedure: APPENDECTOMY LAPAROSCOPIC;  Surgeon: Charlie FORBES Fell, MD;  Location: [facility];  Service: General;  Laterality: N/A;   LAPAROSCOPY N/A  11/10/2014   Procedure: LAPAROSCOPY DIAGNOSTIC;  Surgeon: Debby JINNY Dinsmore, MD;  Location: [facility];  Service: Gynecology;  Laterality: N/A;   TUBAL LIGATION      OB History     Gravida  2   Para  1   Term  1   Preterm      AB      Living  1      SAB      IAB      Ectopic      Multiple  0   Live Births  1            Home Medications    Prior to Admission medications   Medication Sig Start Date End Date Taking? Authorizing Provider  ibuprofen  (ADVIL ) 600 MG tablet Take 1 tablet (600 mg total) by mouth every 6 (six) hours as needed for headache. 11/22/23  Yes Arvis Huxley B, PA-C  ipratropium (ATROVENT ) 0.06 % nasal spray Place 2 sprays into both nostrils 4 (four) times daily. 11/22/23  Yes Arvis Huxley NOVAK, PA-C  meclizine (ANTIVERT) 25 MG tablet Take 1 tablet (25 mg total) by mouth 3 (three) times daily as needed for dizziness. 11/22/23  Yes Arvis Huxley NOVAK, PA-C  levothyroxine (SYNTHROID) 88 MCG tablet Take 1 tablet (88 mcg total) by mouth daily. Administer consistently in the morning on an empty stomach, at least 30 to 60 minutes before food. Alternatively, may consistently administer at night 3 to 4 hours after the last meal. 05/19/23 06/18/23  [provider]    Family History Family History  Problem Relation Age of Onset   Cancer Mother        cervical   Diabetes Father    Diabetes Sister    Cancer Maternal Grandmother    Depression Paternal Grandmother    Diabetes Paternal Grandfather     Social History Social History   Tobacco Use   Smoking status: Every Day    Current packs/day: 1.00    Types: Cigarettes   Smokeless tobacco: Never  Vaping Use   Vaping status: Every Day  Substance Use Topics   Alcohol use: Yes    Comment: social   Drug use: No     Allergies   Patient has no known allergies.   Review of Systems Review of Systems  Constitutional:  Negative for chills, diaphoresis, fatigue and fever.  HENT:  Positive for  congestion. Negative for ear pain, rhinorrhea, sinus pain and sore throat.   Respiratory:  Negative for cough and shortness of breath.   Cardiovascular:  Negative for chest pain and palpitations.  Gastrointestinal:  Positive for nausea. Negative for abdominal pain, diarrhea and vomiting.  Musculoskeletal:  Negative for myalgias.  Skin:  Negative for rash.  Neurological:  Positive for dizziness and headaches. Negative for syncope, weakness, light-headedness and numbness.  Hematological:  Negative for adenopathy.     Physical Exam Triage Vital Signs ED Triage Vitals  Encounter Vitals Group     BP 11/22/23 1036 121/83     Girls Systolic BP Percentile --      Girls Diastolic BP Percentile --      Boys Systolic BP Percentile --      Boys Diastolic BP Percentile --      Pulse Rate 11/22/23 1036 79     Resp 11/22/23 1036 16     Temp 11/22/23 1036 98.2 F (36.8 C)     Temp Source 11/22/23 1036 Oral     SpO2 11/22/23 1036 97 %     Weight 11/22/23 1034 171 lb 14.4 oz (78 kg)     Height --      Head Circumference --      Peak Flow --      Pain Score 11/22/23 1035 3     Pain Loc --      Pain Education --      Exclude from Growth Chart --    No data found.  Updated Vital Signs BP 121/83 (BP Location: Right Arm)   Pulse 79   Temp 98.2 F (36.8 C) (Oral)   Resp 16   Wt 171 lb 14.4 oz (78 kg)   SpO2 97%   BMI 29.51 kg/m       Physical Exam Vitals and nursing note reviewed.  Constitutional:      General: She is not in acute distress.    Appearance: Normal appearance. She is not ill-appearing or toxic-appearing.  HENT:     Head: Normocephalic and atraumatic.     Right Ear: Ear canal and external ear normal. A middle ear effusion is present.     Left Ear: Tympanic membrane, ear canal and external ear normal.     Nose: Congestion present.     Mouth/Throat:     Mouth: Mucous membranes are moist.     Pharynx: Oropharynx is clear.  Eyes:     General: No scleral icterus.        Right eye: No discharge.        Left eye: No  discharge.     Extraocular Movements: Extraocular movements intact.     Conjunctiva/sclera: Conjunctivae normal.     Pupils: Pupils are equal, round, and reactive to light.  Cardiovascular:     Rate and Rhythm: Normal rate and regular rhythm.     Heart sounds: Normal heart sounds.  Pulmonary:     Effort: Pulmonary effort is normal. No respiratory distress.     Breath sounds: Normal breath sounds.  Musculoskeletal:     Cervical back: Neck supple.  Skin:    General: Skin is dry.  Neurological:     General: No focal deficit present.     Mental Status: She is alert. Mental status is at baseline.     Cranial Nerves: No cranial nerve deficit.     Motor: No weakness.     Coordination: Coordination normal.     Gait: Gait normal.     Comments: 5 out of 5 strength bilateral upper and lower extremities  Psychiatric:        Mood and Affect: Mood normal.        Behavior: Behavior normal.      UC Treatments / Results  Labs (all labs ordered are listed, but only abnormal results are displayed) Labs Reviewed - No data to display  EKG   Radiology No results found.  Procedures Procedures (including critical care time)  Medications Ordered in UC Medications - No data to display  Initial Impression / Assessment and Plan / UC Course  I have reviewed the triage vital signs and the nursing notes.  Pertinent labs & imaging results that were available during my care of the patient were reviewed by me and considered in my medical decision making (see chart for details).   28 year old female presents for dizziness, headaches and nausea that started this morning.  Has not taken any OTC meds.  Seen here last month for viral URI.  Has had intermittent congestion for couple months but denies current congestion, sinus pain, ear pain, vomiting, chest pain, palpitations or breathing issues.  Vitals are all stable and normal and she is overall  well-appearing.  On exam she has effusion of the right TM and slight nasal congestion.  Chest clear.  Heart regular rate and rhythm.  Normal neuroexam.  Dizziness.  Acute serous otitis media.  She does have meclizine, ibuprofen  and Atrovent  nasal spray.  Encouraged increasing rest and fluids, being careful position changes.  Thoroughly reviewed return and ER precautions.   Final Clinical Impressions(s) / UC Diagnoses   Final diagnoses:  Dizziness  Right acute serous otitis media, recurrence not specified  Acute nonintractable headache, unspecified headache type     Discharge Instructions      - I sent medication called meclizine to help with dizziness.  I also sent ibuprofen  for your headache and nasal spray.  Increase rest and fluids to be careful position changes.  If symptoms are not improving over the next couple days or they worsen you should seek reevaluation. - If at any point you feel faint or pass out, have chest pain, racing heart, breathing difficulty, severe headaches, vision changes, weakness, excessive vomiting please go to the ER.   ED Prescriptions     Medication Sig Dispense Auth. Provider   ibuprofen  (ADVIL ) 600 MG tablet Take 1 tablet (600 mg total) by mouth every 6 (six) hours as needed for headache. 30 tablet Arvis Jolan NOVAK, PA-C   meclizine (ANTIVERT) 25 MG tablet Take 1 tablet (25 mg total) by mouth 3 (  three) times daily as needed for dizziness. 30 tablet Arvis Huxley B, PA-C   ipratropium (ATROVENT ) 0.06 % nasal spray Place 2 sprays into both nostrils 4 (four) times daily. 15 mL Arvis Huxley NOVAK, PA-C      PDMP not reviewed this encounter.   Arvis Huxley NOVAK, PA-C 11/22/23 1056

## 2023-11-22 NOTE — Discharge Instructions (Addendum)
-   I sent medication called meclizine to help with dizziness.  I also sent ibuprofen  for your headache and nasal spray.  Increase rest and fluids to be careful position changes.  If symptoms are not improving over the next couple days or they worsen you should seek reevaluation. - If at any point you feel faint or pass out, have chest pain, racing heart, breathing difficulty, severe headaches, vision changes, weakness, excessive vomiting please go to the ER.

## 2024-03-28 ENCOUNTER — Ambulatory Visit: Admitting: Internal Medicine
